# Patient Record
Sex: Female | Born: 1990 | Race: Black or African American | Hispanic: No | Marital: Single | Smoking: Never smoker
Health system: Southern US, Community
[De-identification: ages and names within clinical notes are randomized; demographics above are authoritative.]

## PROBLEM LIST (undated history)

## (undated) DIAGNOSIS — A64 Unspecified sexually transmitted disease: Secondary | ICD-10-CM

## (undated) DIAGNOSIS — R87619 Unspecified abnormal cytological findings in specimens from cervix uteri: Secondary | ICD-10-CM

## (undated) DIAGNOSIS — F419 Anxiety disorder, unspecified: Secondary | ICD-10-CM

## (undated) HISTORY — DX: Unspecified abnormal cytological findings in specimens from cervix uteri: R87.619

## (undated) HISTORY — DX: Unspecified sexually transmitted disease: A64

## (undated) HISTORY — DX: Anxiety disorder, unspecified: F41.9

## (undated) HISTORY — PX: HERNIA REPAIR: SHX51

## (undated) HISTORY — PX: APPENDECTOMY: SHX54

---

## 2009-11-28 DIAGNOSIS — A64 Unspecified sexually transmitted disease: Secondary | ICD-10-CM

## 2009-11-28 HISTORY — DX: Unspecified sexually transmitted disease: A64

## 2010-08-31 ENCOUNTER — Encounter (INDEPENDENT_AMBULATORY_CARE_PROVIDER_SITE_OTHER): Payer: Self-pay

## 2010-08-31 ENCOUNTER — Inpatient Hospital Stay (HOSPITAL_COMMUNITY): Admission: EM | Admit: 2010-08-31 | Discharge: 2010-09-01 | Payer: Self-pay | Admitting: Emergency Medicine

## 2011-02-10 LAB — DIFFERENTIAL
Basophils Relative: 0 % (ref 0–1)
Lymphocytes Relative: 21 % (ref 12–46)
Monocytes Absolute: 1.5 10*3/uL — ABNORMAL HIGH (ref 0.1–1.0)
Monocytes Relative: 14 % — ABNORMAL HIGH (ref 3–12)
Neutro Abs: 6.7 10*3/uL (ref 1.7–7.7)

## 2011-02-10 LAB — WET PREP, GENITAL
Trich, Wet Prep: NONE SEEN
WBC, Wet Prep HPF POC: NONE SEEN
Yeast Wet Prep HPF POC: NONE SEEN

## 2011-02-10 LAB — BASIC METABOLIC PANEL
Chloride: 108 mEq/L (ref 96–112)
GFR calc non Af Amer: >60 mL/min (ref >60)
Glucose, Bld: 113 mg/dL — ABNORMAL HIGH (ref 70–99)
Potassium: 3.7 mEq/L (ref 3.5–5.1)
Sodium: 140 mEq/L (ref 135–145)

## 2011-02-10 LAB — CBC
HCT: 37.7 % (ref 36.0–46.0)
Hemoglobin: 12.4 g/dL (ref 12.0–15.0)
MCHC: 32.9 g/dL (ref 30.0–36.0)
MCV: 92.2 fL (ref 78.0–100.0)

## 2011-02-10 LAB — URINE MICROSCOPIC-ADD ON

## 2011-02-10 LAB — POCT PREGNANCY, URINE: Preg Test, Ur: NEGATIVE

## 2011-02-10 LAB — URINALYSIS, ROUTINE W REFLEX MICROSCOPIC
Ketones, ur: NEGATIVE mg/dL
Nitrite: NEGATIVE
Protein, ur: NEGATIVE mg/dL
Urobilinogen, UA: 1 mg/dL (ref 0.0–1.0)

## 2014-02-16 ENCOUNTER — Encounter (HOSPITAL_COMMUNITY): Payer: Self-pay | Admitting: Emergency Medicine

## 2014-02-16 ENCOUNTER — Emergency Department (HOSPITAL_COMMUNITY)
Admission: EM | Admit: 2014-02-16 | Discharge: 2014-02-17 | Disposition: A | Discharge status: Home / Self Care | Payer: 59 | Attending: Emergency Medicine | Admitting: Emergency Medicine

## 2014-02-16 DIAGNOSIS — R110 Nausea: Secondary | ICD-10-CM | POA: Insufficient documentation

## 2014-02-16 DIAGNOSIS — Z3202 Encounter for pregnancy test, result negative: Secondary | ICD-10-CM | POA: Insufficient documentation

## 2014-02-16 DIAGNOSIS — Z9089 Acquired absence of other organs: Secondary | ICD-10-CM | POA: Insufficient documentation

## 2014-02-16 DIAGNOSIS — R1013 Epigastric pain: Secondary | ICD-10-CM | POA: Insufficient documentation

## 2014-02-16 MED ORDER — ONDANSETRON 8 MG PO TBDP
8.0000 mg | ORAL_TABLET | Freq: Once | ORAL | Status: AC
  Administered 2014-02-17: 8 mg via ORAL
  Filled 2014-02-16: qty 1

## 2014-02-16 NOTE — ED Notes (Signed)
Pt reports the last 2 days she has felt nausea when she smells certain foods. Pt also reports headache with epigastric pain. Pt denies any vomiting. Pt alert and ambulatory. Pt last depo shot was May 2014 and has had irregular menstrual cycles that started back in December 2014.

## 2014-02-17 ENCOUNTER — Encounter (HOSPITAL_COMMUNITY): Payer: Self-pay | Admitting: Emergency Medicine

## 2014-02-17 ENCOUNTER — Emergency Department (HOSPITAL_COMMUNITY)
Admission: EM | Admit: 2014-02-17 | Discharge: 2014-02-17 | Disposition: A | Discharge status: Home / Self Care | Payer: 59 | Attending: Emergency Medicine | Admitting: Emergency Medicine

## 2014-02-17 DIAGNOSIS — L03119 Cellulitis of unspecified part of limb: Principal | ICD-10-CM

## 2014-02-17 DIAGNOSIS — Z79899 Other long term (current) drug therapy: Secondary | ICD-10-CM | POA: Insufficient documentation

## 2014-02-17 DIAGNOSIS — L02415 Cutaneous abscess of right lower limb: Secondary | ICD-10-CM

## 2014-02-17 DIAGNOSIS — L02419 Cutaneous abscess of limb, unspecified: Secondary | ICD-10-CM | POA: Insufficient documentation

## 2014-02-17 LAB — COMPREHENSIVE METABOLIC PANEL
ALT: 18 U/L (ref 0–35)
AST: 22 U/L (ref 0–37)
Albumin: 3.8 g/dL (ref 3.5–5.2)
Alkaline Phosphatase: 61 U/L (ref 39–117)
BUN: 9 mg/dL (ref 6–23)
CALCIUM: 9.7 mg/dL (ref 8.4–10.5)
CO2: 26 meq/L (ref 19–32)
CREATININE: 0.89 mg/dL (ref 0.50–1.10)
Chloride: 101 mEq/L (ref 96–112)
GLUCOSE: 84 mg/dL (ref 70–99)
Potassium: 4.1 mEq/L (ref 3.7–5.3)
SODIUM: 141 meq/L (ref 137–147)
TOTAL PROTEIN: 7.7 g/dL (ref 6.0–8.3)
Total Bilirubin: <0.2 mg/dL — ABNORMAL LOW (ref 0.3–1.2)

## 2014-02-17 LAB — URINALYSIS, ROUTINE W REFLEX MICROSCOPIC
Bilirubin Urine: NEGATIVE
GLUCOSE, UA: NEGATIVE mg/dL
HGB URINE DIPSTICK: NEGATIVE
KETONES UR: NEGATIVE mg/dL
LEUKOCYTES UA: NEGATIVE
Nitrite: NEGATIVE
PROTEIN: NEGATIVE mg/dL
Specific Gravity, Urine: 1.03 (ref 1.005–1.030)
UROBILINOGEN UA: 0.2 mg/dL (ref 0.0–1.0)
pH: 5.5 (ref 5.0–8.0)

## 2014-02-17 LAB — PREGNANCY, URINE: Preg Test, Ur: NEGATIVE

## 2014-02-17 LAB — CBC WITH DIFFERENTIAL/PLATELET
Basophils Absolute: 0 10*3/uL (ref 0.0–0.1)
Basophils Relative: 0 % (ref 0–1)
EOS ABS: 0.1 10*3/uL (ref 0.0–0.7)
EOS PCT: 1 % (ref 0–5)
HEMATOCRIT: 38.7 % (ref 36.0–46.0)
HEMOGLOBIN: 12.7 g/dL (ref 12.0–15.0)
LYMPHS ABS: 4.1 10*3/uL — AB (ref 0.7–4.0)
LYMPHS PCT: 51 % — AB (ref 12–46)
MCH: 30 pg (ref 26.0–34.0)
MCHC: 32.8 g/dL (ref 30.0–36.0)
MCV: 91.3 fL (ref 78.0–100.0)
MONO ABS: 0.5 10*3/uL (ref 0.1–1.0)
MONOS PCT: 6 % (ref 3–12)
Neutro Abs: 3.3 10*3/uL (ref 1.7–7.7)
Neutrophils Relative %: 42 % — ABNORMAL LOW (ref 43–77)
PLATELETS: 280 10*3/uL (ref 150–400)
RBC: 4.24 MIL/uL (ref 3.87–5.11)
RDW: 13.5 % (ref 11.5–15.5)
WBC: 8 10*3/uL (ref 4.0–10.5)

## 2014-02-17 LAB — LIPASE, BLOOD: LIPASE: 16 U/L (ref 11–59)

## 2014-02-17 MED ORDER — METOCLOPRAMIDE HCL 5 MG/ML IJ SOLN
10.0000 mg | Freq: Once | INTRAMUSCULAR | Status: AC
  Administered 2014-02-17: 10 mg via INTRAMUSCULAR
  Filled 2014-02-17: qty 2

## 2014-02-17 MED ORDER — OMEPRAZOLE 20 MG PO CPDR: 20.0000 mg | DELAYED_RELEASE_CAPSULE | Freq: Every day | ORAL | Status: DC

## 2014-02-17 MED ORDER — TRAMADOL HCL 50 MG PO TABS: 50.0000 mg | ORAL_TABLET | Freq: Four times a day (QID) | ORAL | Status: DC | PRN

## 2014-02-17 MED ORDER — ONDANSETRON HCL 4 MG PO TABS: 4.0000 mg | ORAL_TABLET | Freq: Four times a day (QID) | ORAL | Status: DC

## 2014-02-17 NOTE — Discharge Instructions (Signed)
Abscess Care After An abscess (also called a boil or furuncle) is an infected area that contains a collection of pus. Signs and symptoms of an abscess include pain, tenderness, redness, or hardness, or you may feel a moveable soft area under your skin. An abscess can occur anywhere in the body. The infection may spread to surrounding tissues causing cellulitis. A cut (incision) by the surgeon was made over your abscess and the pus was drained out. Gauze may have been packed into the space to provide a drain that will allow the cavity to heal from the inside outwards. The boil may be painful for 5 to 7 days. Most people with a boil do not have high fevers. Your abscess, if seen early, may not have localized, and may not have been lanced. If not, another appointment may be required for this if it does not get better on its own or with medications. HOME CARE INSTRUCTIONS   Only take over-the-counter or prescription medicines for pain, discomfort, or fever as directed by your caregiver.  When you bathe, soak and then remove gauze or iodoform packs at least daily or as directed by your caregiver. You may then wash the wound gently with mild soapy water. Repack with gauze or do as your caregiver directs. SEEK IMMEDIATE MEDICAL CARE IF:   You develop increased pain, swelling, redness, drainage, or bleeding in the wound site.  You develop signs of generalized infection including muscle aches, chills, fever, or a general ill feeling.  An oral temperature above 102 F (38.9 C) develops, not controlled by medication. See your caregiver for a recheck if you develop any of the symptoms described above. If medications (antibiotics) were prescribed, take them as directed. Document Released: 06/02/2005 Document Revised: 02/06/2012 Document Reviewed: 01/28/2008 [facility] Patient Information 2014 [place], [place].  Emergency Department Resource Guide 1) Find a Doctor and Pay Out of Pocket Although you won't have  to find out who is covered by your insurance plan, it is a good idea to ask around and get recommendations. You will then need to call the office and see if the doctor you have chosen will accept you as a new patient and what types of options they offer for patients who are self-pay. Some doctors offer discounts or will set up payment plans for their patients who do not have insurance, but you will need to ask so you aren't surprised when you get to your appointment.  2) Contact Your Local Health Department Not all health departments have doctors that can see patients for sick visits, but many do, so it is worth a call to see if yours does. If you don't know where your local health department is, you can check in your phone book. The CDC also has a tool to help you locate your state's health department, and many state websites also have listings of all of their local health departments.  3) Find a Walk-in Clinic If your illness is not likely to be very severe or complicated, you may want to try a walk in clinic. These are popping up all over the country in pharmacies, drugstores, and shopping centers. They're usually staffed by nurse practitioners or physician assistants that have been trained to treat common illnesses and complaints. They're usually fairly quick and inexpensive. However, if you have serious medical issues or chronic medical problems, these are probably not your best option.  No Primary Care Doctor: - Call Health Connect at  [phone] - they can help you locate a  primary care doctor that  accepts your insurance, provides certain services, etc. - Physician Referral Service- [phone]  Chronic Pain Problems: Organization         Address  Phone   Notes  [facility]  [phone] Patients need to be referred by their primary care doctor.   Medication Assistance: Organization         Address  Phone   Notes  [facility] Medication [facility] [address] [place], [place] [address] [phone] --Must be a resident of [facility] -- Must have NO insurance coverage whatsoever (no Medicaid/ Medicare, etc.) -- The pt. MUST have a primary care doctor that directs their care regularly and follows them in the community   [facility]ist  [phone]   [employer]  [phone]    Agencies that provide inexpensive medical care: Organization         Address                                                       Phone                                                                            Notes  [facility]  [phone]   [facility]    [phone]   [facility] [address] [place], [place] [address] [phone]   [facility] [address] [place] [place] [phone]   Planned Parenthood    [phone]   [facility]    [phone]   [facility]  [address], [place] Phone:  [phone], Fax:  [phone] Hours of Operation:  9 am - 6 pm, M-F.  Also accepts Medicaid/Medicare and self-pay.  [facility]  [address], [place] Phone: [phone], Fax: [phone]. Hours of Operation:  8:30 am - 5:30 pm, M-F.  Also accepts Medicaid and self-pay.  [facility] [place] [address] [place] Phone: [phone]   [facility] [address] Natasha Bence [place], [place] [phone], Ext. [phone] Mondays & Thursdays: 7-9 AM.  First 15 patients are seen on a first come, first serve basis.    Medicaid-accepting [facility] Providers:  Organization         Address                                                                       Phone                               Notes  [facility] [address], [place] [phone] Also accepts self-pay patients.  [facility] [address], [place]  [phone]) [phone]   [facility] [address], [place] [phone]   [facility] [address] [place] [address], [place] [phone]) [phone]   Renaye RakersVeita Bland [address], [place]   [phone]) [phone] Only accepts [place] Access [place] patients after they have their name applied to their card.   Self-Pay (no insurance) in [place] [place] [facility]:   Organization         Address                                                     Phone               Notes  Sickle Cell Patients, [facility] [address], [place] [phone]) [phone]   [facility] [address], [place] [phone]) [phone]   [facility]  [address], [place] [phone]) [phone]   [facility]/Dr. Osei-Bonsu  [address] [place] [address], [place] or [phone], [place] [phone] Phone number for both [place] and [place] locations is the same.  [facility] [address], [place] [phone]   [facility]  [address] [place] [address], [place] or [address] [phone](336) [phone]   [facility] [address], [place] [phone]) [phone], phone; [phone](336) [phone], fax Sees patients 1st and 3rd Saturday of every month.  Must not qualify for public or private insurance (i.e. Medicaid, Medicare, [place] Health Choice, Veterans' Benefits)  Household income should be no more than 200% of the poverty level The clinic cannot treat you if you are pregnant or think you are pregnant  Sexually transmitted diseases are not treated at the clinic.    Dental Care: Organization         Address                                  Phone                       Notes  [facility] [address], [place] [phone] Accepts children up to age 24 who are  enrolled in [place]icaid or [place] Health Choice; pregnant women with a Medicaid card; and children who have applied for Medicaid or [place] Health Choice, but were declined, whose parents can pay a reduced fee at time of service.  [facility] [place]  [address], [place] [phone]) [phone] Accepts children up to age 24 who are enrolled in [place]icaid or [place] Health Choice; pregnant women with a Medicaid card; and children who have applied for Medicaid or [place] Health Choice, but were declined, whose parents can pay a reduced fee at time of service.  [facility]  [address], [place] [phone]) [phone] Patients are seen by appointment only. Walk-ins are not accepted. [facility] will see patients 24 years of age  and older. Monday - Tuesday (8am-5pm) Most Wednesdays (8:30-5pm) $30 per visit, cash only  [facility]  [address], [place] [phone]) [phone] Patients are seen by appointment only. Walk-ins are not accepted. [facility] will see patients 24 years of age and older. One Wednesday Evening (Monthly: Volunteer Based).  $30 per visit, cash only  [facility]  [phone] for adults; Children under age 344, call Graduate Pediatric Dentistry at [phone]) [phone]. Children aged 564-14, please call [phone] to request a pediatric application.  Dental services are provided in all areas of dental care including fillings, crowns and bridges, complete and partial dentures, implants, gum treatment, root canals, and extractions. Preventive care is also provided. Treatment is provided to both adults and children. Patients are selected via a lottery and there is often a waiting list.   [facility] [address], [place]  [phone]) [phone] [email or web address]   [facility] [address], [place], [place] [phone], Ext. [phone] Second and Fourth Thursday of each month,  opens at 6:30 AM; Clinic ends at 9 AM.  Patients are seen on a first-come first-served basis, and a limited number are seen during each clinic.   [facility]  [address], [place], [place] [phone]) [phone]   Eligibility Requirements You must have lived in [place], [place], or [place] counties for at least the last three months.   You cannot be eligible for state or federal sponsored National Cityhealthcare insurance, including CIGNAVeterans Administration, [place]icaid, or [employer]Medicare.   You generally cannot be eligible for healthcare insurance through your employer.    How to apply: Eligibility screenings are held every Tuesday and Wednesday afternoon from 1:00 pm until 4:00 pm. You do not need an appointment for the interview!  [facility] [address], [place], [place] [phone]   [facility]  [phone]   [facility]  [phone]   [facility]  [phone]    Behavioral Health Resources in the Community: Intensive Outpatient Programs Organization         Address                                              Phone              Notes  [facility] [address], [place], [place] [phone]   [facility] [address], [place], [place] [phone]   [facility] [address], [place], [place]  [phone]   [facility] [address],  [place], [place] [phone] or [phone]   Substance Abuse Resources Organization         Address                                Phone  Notes  [facility]  [phone]   [facility]  [phone]   [facility]  [phone]   [facility]  [phone]   Residential & Outpatient Substance Abuse Program  [phone]   Psychological Services Organization         Address  Phone                Notes  [employer] [facility] [employer] [facility]  [phone]   [facility]  [phone]   [facility] [address] [place] [phone] or [phone]    Mobile Crisis Teams Organization         Address  Phone  Notes  [facility], Mobile Crisis Care Unit  [phone]   [facility]  [address] [place], [place] [phone]   Doristine Locks [address] [place] [phone]    Self-Help/Support Groups Organization         Address                         Phone             Notes  [facility] [place] - variety of support groups  [phone]- I7437963 Call for more information  Narcotics Anonymous (NA), [facility] [address], [employer] [place]  2 meetings at this location   Statistician         Address                                                    Phone              Notes  [facility] [address],    [place]  [phone]   [facility]  [address], [place] [address], [place], [place] [phone]   [facility] [address] [place], [place] [phone] Admissions: 8am-3pm M-F  [facility] [address]    [place], [place] [phone]   [facility] [address] [place], [place], [place] [phone]   [facility] [address]  [place], [place] [phone]   [facility] - Intensive Outpatient [address], [place], [place] [phone]   [facility] [address] [place]  [place], [place] [phone] or [phone]   [facility] ([facility]) [address], [place], [place] [phone] Accepts Medicaid  [facility] [address] [place] [address]  [place] [phone] Substance Abuse/Addiction Treatment   [facility] Organization         Address                                                             Phone                    Notes  [facility]  [phone]   Angie Fava, PhD [address] [place]vin [place], [place]   [phone] or [phone]   [facility]   [address] [place], [place] [phone]   [facility] [address] [place], [place] [phone] Insurance/Medicaid/sponsorship through [employer] and Families [address]  [place], [place] [phone] [place], [place] [phone]    Dr. Adele Schilder  [phone]   [facility] Dept. 1) [address], [place] 2) [place] 3)  [address], [place] [phone]  [phone]   [place] [phone] or [phone] (After Hours)

## 2014-02-17 NOTE — Discharge Instructions (Signed)
Your symptoms may be caused by a viral infection. They may also be the result of reflux. Recommend he take omeprazole as prescribed. You may take Zofran as needed for nausea. Followup with primary care doctor. Return if symptoms worsen.  Nausea, Adult Nausea is the feeling that you have an upset stomach or have to vomit. Nausea by itself is not likely a serious concern, but it may be an early sign of more serious medical problems. As nausea gets worse, it can lead to vomiting. If vomiting develops, there is the risk of dehydration.  CAUSES   Viral infections.  Food poisoning.  Medicines.  Pregnancy.  Motion sickness.  Migraine headaches.  Emotional distress.  Severe pain from any source.  Alcohol intoxication. HOME CARE INSTRUCTIONS  Get plenty of rest.  Ask your caregiver about specific rehydration instructions.  Eat small amounts of food and sip liquids more often.  Take all medicines as told by your caregiver. SEEK MEDICAL CARE IF:  You have not improved after 2 days, or you get worse.  You have a headache. SEEK IMMEDIATE MEDICAL CARE IF:   You have a fever.  You faint.  You keep vomiting or have blood in your vomit.  You are extremely weak or dehydrated.  You have dark or bloody stools.  You have severe chest or abdominal pain. MAKE SURE YOU:  Understand these instructions.  Will watch your condition.  Will get help right away if you are not doing well or get worse. Document Released: 12/22/2004 Document Revised: 08/08/2012 Document Reviewed: 07/27/2011 [facility] Patient Information 2014 [place], [place].

## 2014-02-17 NOTE — ED Notes (Signed)
Pt woke up this morning with reddened knot on inner right thigh, pt states when she squeezes it pus and blood will come out.

## 2014-02-17 NOTE — ED Provider Notes (Signed)
Medical screening examination/treatment/procedure(s) were performed by non-physician practitioner and as supervising physician I was immediately available for consultation/collaboration.   EKG Interpretation None        Evanie Buckle, MD 02/17/14 0555 

## 2014-02-17 NOTE — ED Provider Notes (Signed)
CSN: [id number]     Arrival date & time 02/17/14  1352 History  This chart was scribed for non-physician practitioner, Junius FinnerErin O'Malley, PA-C working with Rolland PorterMark James, MD by Greggory StallionKayla Andersen, ED scribe. This patient was seen in room WTR6/WTR6 and the patient's care was started at 3:42 PM.   Chief Complaint  Patient presents with  . Abscess   The history is provided by the patient. No language interpreter was used.   HPI Comments: Mercedes Watson is a 23 y.o. female who presents to the Emergency Department complaining of an abscess to her right inner thigh that she noticed this morning. Pt states it is red and painful. There has been pus and blood drainage. She was evaluated last night for dizziness, nausea and headache and given Zofran, Reglan and omeprazole. Pt is unsure if she is having an allergic reaction. Denies fever, abdominal pain, vaginal discharge, vaginal bleeding, difficulty urinating, dysuria, hematuria, urinary frequency, urinary urgency.   History reviewed. No pertinent past medical history. Past Surgical History  Procedure Laterality Date  . Appendectomy    . Hernia repair     No family history on file. History  Substance Use Topics  . Smoking status: Never Smoker   . Smokeless tobacco: Not on file  . Alcohol Use: Yes   OB History   Grav Para Term Preterm Abortions TAB SAB Ect Mult Living                 Review of Systems  Constitutional: Negative for fever.  Gastrointestinal: Negative for abdominal pain.  Genitourinary: Negative for dysuria, urgency, frequency, vaginal bleeding, vaginal discharge and difficulty urinating.  Skin:       Positive for abscess.   All other systems reviewed and are negative.   Allergies  Review of patient's allergies indicates no known allergies.  Home Medications   Current Outpatient Rx  Name  Route  Sig  Dispense  Refill  . aspirin-acetaminophen-caffeine (EXCEDRIN MIGRAINE) 250-250-65 MG per tablet   Oral   Take 1 tablet by mouth  every 6 (six) hours as needed for headache.         Marland Kitchen. omeprazole (PRILOSEC) 20 MG capsule   Oral   Take 1 capsule (20 mg total) by mouth daily.   30 capsule   0   . ondansetron (ZOFRAN) 4 MG tablet   Oral   Take 1 tablet (4 mg total) by mouth every 6 (six) hours.   12 tablet   0   . traMADol (ULTRAM) 50 MG tablet   Oral   Take 1 tablet (50 mg total) by mouth every 6 (six) hours as needed.   15 tablet   0    BP 118/68  Pulse 78  Temp(Src) 98.6 F (37 C) (Oral)  Resp 18  SpO2 99%  LMP 02/10/2014  Physical Exam  Nursing note and vitals reviewed. Constitutional: She is oriented to person, place, and time. She appears well-developed and well-nourished.  Pt appears well, non-toxic.  HENT:  Head: Normocephalic and atraumatic.  Eyes: EOM are normal.  Neck: Normal range of motion.  Cardiovascular: Normal rate.   Pulmonary/Chest: Effort normal.  Musculoskeletal: Normal range of motion.  Neurological: She is alert and oriented to person, place, and time.  Skin: Skin is warm and dry.  1 cm area of induration and tenderness on right proximal medial thigh. 2-3 cm area of surrounding fluctuance. No discharge, warmth or red streaking.   Psychiatric: She has a normal mood and affect. Her  behavior is normal.    ED Course  Procedures (including critical care time)  DIAGNOSTIC STUDIES: Oxygen Saturation is 99% on RA, normal by my interpretation.    COORDINATION OF CARE: 3:44 PM-Discussed treatment plan which includes I&D with pt at bedside and pt agreed to plan.   INCISION AND DRAINAGE Performed by: Junius Finner PA-C Consent: Verbal consent obtained. Risks and benefits: risks, benefits and alternatives were discussed Type: abscess  Body area: right proximal medial thigh  Anesthesia: local infiltration  Incision was made with a scalpel.  Local anesthetic: lidocaine 1% with epinephrine  Anesthetic total: 1 ml  Complexity: complex Blunt dissection to break up  loculations  Drainage: bloody  Drainage amount: scant  Packing material: 1/4 in iodoform gauze  Patient tolerance: Patient tolerated the procedure well with no immediate complications.  Labs Review Labs Reviewed - No data to display Imaging Review No results found.   EKG Interpretation None      MDM   Final diagnoses:  Abscess of right thigh    pt presenting with abscess in right medial thigh. Appears well, non-toxic. I&D performed. See procedure note. Advised to f/u within 48 hours for wound recheck and packing change. Return precautions provided. Pt verbalized understanding and agreement with tx plan.   I personally performed the services described in this documentation, which was scribed in my presence. The recorded information has been reviewed and is accurate.   Junius Finner, PA-C 02/18/14 1146

## 2014-02-17 NOTE — ED Provider Notes (Signed)
CSN: [id number]     Arrival date & time 02/16/14  2316 History   First MD Initiated Contact with Patient 02/17/14 0200     Chief Complaint  Patient presents with  . Nausea  . Abdominal Pain    (Consider location/radiation/quality/duration/timing/severity/associated sxs/prior Treatment) HPI Comments: Patient is a 23 year old female with no PMHx who presents to the emergency department for 2 days of nausea. Patient states that nausea is worse in after eating. It is intermittent. She endorses an associated discomfort in her epigastric region that is nonradiating. Patient has not tried anything for symptoms. She denies associated fever, chest pain or shortness of breath, vomiting or hematemesis, dysuria or hematuria, diarrhea, or hematochezia. SHx significant for appendectomy. She has had normal BMs daily; last BM was yesterday AM.  Patient is a 23 y.o. female presenting with abdominal pain. The history is provided by the patient. No language interpreter was used.  Abdominal Pain Associated symptoms: nausea   Associated symptoms: no chest pain, no diarrhea, no dysuria, no fever, no hematuria, no shortness of breath, no vaginal bleeding, no vaginal discharge and no vomiting     History reviewed. No pertinent past medical history. Past Surgical History  Procedure Laterality Date  . Appendectomy    . Hernia repair     History reviewed. No pertinent family history. History  Substance Use Topics  . Smoking status: Never Smoker   . Smokeless tobacco: Not on file  . Alcohol Use: Yes   OB History   Grav Para Term Preterm Abortions TAB SAB Ect Mult Living                 Review of Systems  Constitutional: Negative for fever.  Respiratory: Negative for shortness of breath.   Cardiovascular: Negative for chest pain.  Gastrointestinal: Positive for nausea and abdominal pain. Negative for vomiting and diarrhea.  Genitourinary: Negative for dysuria, hematuria, vaginal bleeding and vaginal  discharge.  All other systems reviewed and are negative.     Allergies  Review of patient's allergies indicates no known allergies.  Home Medications   Current Outpatient Rx  Name  Route  Sig  Dispense  Refill  . aspirin-acetaminophen-caffeine (EXCEDRIN MIGRAINE) 250-250-65 MG per tablet   Oral   Take 1 tablet by mouth every 6 (six) hours as needed for headache.         Marland Kitchen. omeprazole (PRILOSEC) 20 MG capsule   Oral   Take 1 capsule (20 mg total) by mouth daily.   30 capsule   0   . ondansetron (ZOFRAN) 4 MG tablet   Oral   Take 1 tablet (4 mg total) by mouth every 6 (six) hours.   12 tablet   0    BP 109/61  Pulse 78  Temp(Src) 99.1 F (37.3 C) (Oral)  Resp 16  Ht 4\' 11"  (1.499 m)  Wt 234 lb 1 oz (106.17 kg)  BMI 47.25 kg/m2  SpO2 100%  LMP 02/10/2014  Physical Exam  Nursing note and vitals reviewed. Constitutional: She is oriented to person, place, and time. She appears well-developed and well-nourished. No distress.  HENT:  Head: Normocephalic and atraumatic.  Mouth/Throat: Oropharynx is clear and moist. No oropharyngeal exudate.  Eyes: Conjunctivae and EOM are normal. Pupils are equal, round, and reactive to light. No scleral icterus.  Neck: Normal range of motion.  Cardiovascular: Normal rate, regular rhythm and normal heart sounds.   Pulmonary/Chest: Effort normal and breath sounds normal. No respiratory distress. She has no wheezes. She  has no rales.  Abdominal: Soft. There is no tenderness. There is no rebound and no guarding.  No peritoneal signs. Abdomen soft.  Musculoskeletal: Normal range of motion.  Neurological: She is alert and oriented to person, place, and time.  Skin: Skin is warm and dry. No rash noted. She is not diaphoretic. No erythema. No pallor.  Psychiatric: She has a normal mood and affect. Her behavior is normal.    ED Course  Procedures (including critical care time) Labs Review Labs Reviewed  CBC WITH DIFFERENTIAL - Abnormal;  Notable for the following:    Neutrophils Relative % 42 (*)    Lymphocytes Relative 51 (*)    Lymphs Abs 4.1 (*)    All other components within normal limits  COMPREHENSIVE METABOLIC PANEL - Abnormal; Notable for the following:    Total Bilirubin <0.2 (*)    All other components within normal limits  LIPASE, BLOOD  PREGNANCY, URINE  URINALYSIS, ROUTINE W REFLEX MICROSCOPIC   Imaging Review No results found.   EKG Interpretation None      MDM   Final diagnoses:  Nausea    23 year old female presents for nausea. SHx significant for appendectomy. Patient as well and nontoxic appearing, hemodynamically stable, and afebrile. Physical exam elicits no abdominal tenderness. Abdomen is soft without peritoneal signs. Negative Murphy's sign. Patient has no leukocytosis today. No anemia or electrolyte imbalance. Liver and kidney function preserved. Urinalysis does not suggest infection.   My suspicion for acute intra-abdominal processes is low in this patient. Patient has had stable serial abdominal examinations without tenderness. Doubt SBO or pSBO given lack of emesis and regular bowel movements. Doubt cholecystitis given negative Murphy sign, lack of leukocytosis, and preserved liver function. Urine pregnancy negative. Do not believe further emergent workup is indicated at this time. Patient stable and appropriate for discharge with prescription for Zofran for nausea as needed. Will also start patient on trial of omeprazole. Return precautions provided and patient agreeable to plan with no unaddressed concerns.   Filed Vitals:   02/17/14 0039 02/17/14 0040 02/17/14 0041 02/17/14 0232  BP: 112/68 124/70 120/78 109/61  Pulse: 99 89 85 78  Temp:      TempSrc:      Resp:    16  Height:      Weight:      SpO2:    100%       Antony Madura, PA-C 02/17/14 [phone]

## 2014-02-22 NOTE — ED Provider Notes (Signed)
Medical screening examination/treatment/procedure(s) were performed by non-physician practitioner and as supervising physician I was immediately available for consultation/collaboration.   EKG Interpretation None        Rolland PorterMark Bernis Schreur, MD 02/22/14 1100

## 2014-11-09 ENCOUNTER — Emergency Department (HOSPITAL_COMMUNITY)
Admission: EM | Admit: 2014-11-09 | Discharge: 2014-11-09 | Disposition: A | Discharge status: Home / Self Care | Payer: Commercial Managed Care - PPO | Attending: Emergency Medicine | Admitting: Emergency Medicine

## 2014-11-09 ENCOUNTER — Encounter (HOSPITAL_COMMUNITY): Payer: Self-pay | Admitting: Emergency Medicine

## 2014-11-09 DIAGNOSIS — J029 Acute pharyngitis, unspecified: Secondary | ICD-10-CM | POA: Diagnosis present

## 2014-11-09 DIAGNOSIS — F419 Anxiety disorder, unspecified: Secondary | ICD-10-CM | POA: Diagnosis not present

## 2014-11-09 DIAGNOSIS — Z792 Long term (current) use of antibiotics: Secondary | ICD-10-CM | POA: Insufficient documentation

## 2014-11-09 DIAGNOSIS — R000 Tachycardia, unspecified: Secondary | ICD-10-CM | POA: Insufficient documentation

## 2014-11-09 DIAGNOSIS — K219 Gastro-esophageal reflux disease without esophagitis: Secondary | ICD-10-CM | POA: Diagnosis not present

## 2014-11-09 DIAGNOSIS — Z3202 Encounter for pregnancy test, result negative: Secondary | ICD-10-CM | POA: Insufficient documentation

## 2014-11-09 LAB — POC URINE PREG, ED: Preg Test, Ur: NEGATIVE

## 2014-11-09 LAB — RAPID STREP SCREEN (MED CTR MEBANE ONLY): Streptococcus, Group A Screen (Direct): NEGATIVE

## 2014-11-09 MED ORDER — OMEPRAZOLE 20 MG PO CPDR: DELAYED_RELEASE_CAPSULE | ORAL | Status: DC

## 2014-11-09 MED ORDER — LIDOCAINE VISCOUS 2 % MT SOLN: 20.0000 mL | OROMUCOSAL | Status: DC | PRN

## 2014-11-09 MED ORDER — SUCRALFATE 1 G PO TABS
1.0000 g | ORAL_TABLET | Freq: Once | ORAL | Status: AC
  Administered 2014-11-09: 1 g via ORAL
  Filled 2014-11-09: qty 1

## 2014-11-09 NOTE — ED Provider Notes (Signed)
Patient is a 23 year old female who presents emergency room for evaluation of burning pain in her throat and chest. Patient was recently tested for STDs after performing oral sex. She was treated for bacterial vaginosis. She complains of continued sore throat despite testing. Patient also reporting history of heartburn that is severe. She has been researching is very anxious. Please see note from previous provider for review of systems and full history. Patient headed off to me at shift change. Physical Exam  BP 132/80 mmHg  Pulse 127  Temp(Src) 97.5 F (36.4 C) (Oral)  Resp 18  Ht 4\' 10"  (1.473 m)  Wt 233 lb (105.688 kg)  BMI 48.71 kg/m2  SpO2 100%  LMP 10/03/2014  Physical Exam  Constitutional: She is oriented to person, place, and time. She appears well-developed and well-nourished. No distress.  HENT:  Head: Normocephalic and atraumatic.  Mouth/Throat: Oropharynx is clear and moist. No oropharyngeal exudate.  Eyes: Conjunctivae and EOM are normal. Pupils are equal, round, and reactive to light. No scleral icterus.  Neck: Normal range of motion. Neck supple. No JVD present. No thyromegaly present.  Cardiovascular: Normal rate, regular rhythm, normal heart sounds and intact distal pulses.  Exam reveals no gallop and no friction rub.   No murmur heard. Pulmonary/Chest: Effort normal and breath sounds normal. No respiratory distress. She has no wheezes. She has no rales. She exhibits no tenderness.  Abdominal: Soft. Bowel sounds are normal. She exhibits no distension and no mass. There is no tenderness. There is no rebound and no guarding.  Musculoskeletal: Normal range of motion.  Lymphadenopathy:    She has no cervical adenopathy.  Neurological: She is alert and oriented to person, place, and time.  Skin: Skin is warm and dry. She is not diaphoretic.  Psychiatric: She has a normal mood and affect. Her behavior is normal. Judgment and thought content normal.  Nursing note and vitals  reviewed.   ED Course  Procedures  MDM Patient is a 23 year old female who presents emergency room for evaluation of sore throat. Physical exam reveals an alert nontoxic-appearing female with no evidence of exudate in her throat. History and physical exam very consistent with GERD. Rapid strep is negative. Urine pregnancy is negative. We will discharge the patient home with Prilosec 3 times a day 2 weeks followed by once daily after that. Patient to be given [facility] information [facility]. Patient to return for difficulty swallowing, shortness of breath, or any other concerning symptoms. She states understanding and agreement at this time. Patient is stable for discharge.      Eben Burowourtney A Forcucci, PA-C 11/09/14 0901  Loren Raceravid Yelverton, MD 11/10/14 [phone]

## 2014-11-09 NOTE — ED Provider Notes (Signed)
CSN: [id number]     Arrival date & time 11/09/14  0501 History   First MD Initiated Contact with Patient 11/09/14 0541     Chief Complaint  Patient presents with  . Sore Throat     (Consider location/radiation/quality/duration/timing/severity/associated sxs/prior Treatment) HPI Comments: Patient recently treated for BV and [facility] STD including  Oral swab for GC due to unprotected oral sex.  All tests negative but has had a burning sensation in her throat and upper abdomen -- worse when laying down. Also is 1 week late for menses.   Patient is a 23 y.o. female presenting with pharyngitis. The history is provided by the patient.  Sore Throat This is a new problem. The current episode started 1 to 4 weeks ago. The problem occurs constantly. The problem has been unchanged. Associated symptoms include abdominal pain and a sore throat. Pertinent negatives include no chest pain, diaphoresis, fever, nausea, rash, vomiting or weakness. The symptoms are aggravated by swallowing and stress. She has tried nothing for the symptoms. The treatment provided no relief.    History reviewed. No pertinent past medical history. Past Surgical History  Procedure Laterality Date  . Appendectomy    . Hernia repair     No family history on file. History  Substance Use Topics  . Smoking status: Never Smoker   . Smokeless tobacco: Not on file  . Alcohol Use: Yes   OB History    No data available     Review of Systems  Constitutional: Negative for fever and diaphoresis.  HENT: Positive for sore throat. Negative for trouble swallowing.   Respiratory: Negative for shortness of breath.   Cardiovascular: Negative for chest pain.  Gastrointestinal: Positive for abdominal pain. Negative for nausea, vomiting, diarrhea and constipation.  Genitourinary: Negative for frequency, vaginal bleeding, vaginal discharge and vaginal pain.  Skin: Negative for rash.  Neurological: Negative  for weakness.  All other systems reviewed and are negative.     Allergies  Review of patient's allergies indicates no known allergies.  Home Medications   Prior to Admission medications   Medication Sig Start Date End Date Taking? Authorizing Provider  fluconazole (DIFLUCAN) 150 MG tablet Take 150 mg by mouth as directed. 1 TABLET ON DAY 1, THEN REPEAT DOSE IN 4 DAYS. (PATIENT TO TAKE ANOTHER DOSE ON 11/10/2014.   Yes Historical Provider, MD  metroNIDAZOLE (FLAGYL) 500 MG tablet Take 500 mg by mouth 3 (three) times daily. 7 DAY THERAPY COURSE PATIENT COMPLETED ON 11/06/2014.   Yes Historical Provider, MD  lidocaine (XYLOCAINE) 2 % solution Use as directed 20 mLs in the mouth or throat as needed for mouth pain. 11/09/14   Courtney A Forcucci, PA-C  omeprazole (PRILOSEC) 20 MG capsule Take tablet twice daily before food for 2 weeks. Then take 1 tablet once daily 30 minutes before food. 11/09/14   Courtney A Forcucci, PA-C  ondansetron (ZOFRAN) 4 MG tablet Take 1 tablet (4 mg total) by mouth every 6 (six) hours. Patient not taking: Reported on 11/09/2014 02/17/14   Antony MaduraKelly Humes, PA-C  traMADol (ULTRAM) 50 MG tablet Take 1 tablet (50 mg total) by mouth every 6 (six) hours as needed. Patient not taking: Reported on 11/09/2014 02/17/14   Junius FinnerErin O'Malley, PA-C   BP 113/77 mmHg  Pulse 86  Temp(Src) 97.5 F (36.4 C) (Oral)  Resp 20  Ht 4\' 10"  (1.473 m)  Wt 233 lb (105.688 kg)  BMI 48.71 kg/m2  SpO2 97%  LMP  10/03/2014 Physical Exam  Constitutional: She is oriented to person, place, and time. She appears well-developed and well-nourished. No distress.  HENT:  Head: Normocephalic and atraumatic.  Right Ear: External ear normal.  Left Ear: External ear normal.  Eyes: Pupils are equal, round, and reactive to light.  Neck: Normal range of motion.  Cardiovascular: Regular rhythm.  Tachycardia present.   Pulmonary/Chest: Effort normal. No respiratory distress. She has no wheezes.  Abdominal:  Soft. Bowel sounds are normal. She exhibits no distension. There is no tenderness.  Musculoskeletal: Normal range of motion.  Neurological: She is alert and oriented to person, place, and time.  Skin: Skin is warm. No rash noted.  Psychiatric: Her speech is normal and behavior is normal. Judgment and thought content normal. Her mood appears anxious. Cognition and memory are normal.  Nursing note and vitals reviewed.   ED Course  Procedures (including critical care time) Labs Review Labs Reviewed  RAPID STREP SCREEN  CULTURE, GROUP A STREP  POC URINE PREG, ED    Imaging Review No results found.   EKG Interpretation None      MDM   Final diagnoses:  Gastroesophageal reflux disease, esophagitis presence not specified  Pharyngitis        Arman FilterGail K Victoriah Wilds, NP 11/09/14 1955  Loren Raceravid Yelverton, MD 11/10/14 [phone]

## 2014-11-09 NOTE — ED Notes (Addendum)
Pt presents with c/o burning or feeling "cold" in throat and upper chest and stomach. Pt states she recently finished tx for BV and yeast infection and Googled what throat s/s could be and became scared. Pt also reports being 7 days late for her period

## 2014-11-09 NOTE — Discharge Instructions (Signed)
Food Choices for Gastroesophageal Reflux Disease When you have gastroesophageal reflux disease (GERD), the foods you eat and your eating habits are very important. Choosing the right foods can help ease the discomfort of GERD. WHAT GENERAL GUIDELINES DO I NEED TO FOLLOW?  Choose fruits, vegetables, whole grains, low-fat dairy products, and low-fat meat, fish, and poultry.  Limit fats such as oils, salad dressings, butter, nuts, and avocado.  Keep a food diary to identify foods that cause symptoms.  Avoid foods that cause reflux. These may be different for different people.  Eat frequent small meals instead of three large meals each day.  Eat your meals slowly, in a relaxed setting.  Limit fried foods.  Cook foods using methods other than frying.  Avoid drinking alcohol.  Avoid drinking large amounts of liquids with your meals.  Avoid bending over or lying down until 2-3 hours after eating. WHAT FOODS ARE NOT RECOMMENDED? The following are some foods and drinks that may worsen your symptoms: Vegetables Tomatoes. Tomato juice. Tomato and spaghetti sauce. Chili peppers. Onion and garlic. Horseradish. Fruits Oranges, grapefruit, and lemon (fruit and juice). Meats High-fat meats, fish, and poultry. This includes hot dogs, ribs, ham, sausage, salami, and bacon. Dairy Whole milk and chocolate milk. Sour cream. Cream. Butter. Ice cream. Cream cheese.  Beverages Coffee and tea, with or without caffeine. Carbonated beverages or energy drinks. Condiments Hot sauce. Barbecue sauce.  Sweets/Desserts Chocolate and cocoa. Donuts. Peppermint and spearmint. Fats and Oils High-fat foods, including [place] fries and potato chips. Other Vinegar. Strong spices, such as black pepper, white pepper, red pepper, cayenne, curry powder, cloves, ginger, and chili powder. The items listed above may not be a complete list of foods and beverages to avoid. Contact your dietitian for more  information. Document Released: 11/14/2005 Document Revised: 11/19/2013 Document Reviewed: 09/18/2013 [facility] Patient Information 2015 [place], [place]. This information is not intended to replace advice given to you by your health care provider. Make sure you discuss any questions you have with your health care provider.  Gastroesophageal Reflux Disease, Adult Gastroesophageal reflux disease (GERD) happens when acid from your stomach flows up into the esophagus. When acid comes in contact with the esophagus, the acid causes soreness (inflammation) in the esophagus. Over time, GERD may create small holes (ulcers) in the lining of the esophagus. CAUSES   Increased body weight. This puts pressure on the stomach, making acid rise from the stomach into the esophagus.  Smoking. This increases acid production in the stomach.  Drinking alcohol. This causes decreased pressure in the lower esophageal sphincter (valve or ring of muscle between the esophagus and stomach), allowing acid from the stomach into the esophagus.  Late evening meals and a full stomach. This increases pressure and acid production in the stomach.  A malformed lower esophageal sphincter. Sometimes, no cause is found. SYMPTOMS   Burning pain in the lower part of the mid-chest behind the breastbone and in the mid-stomach area. This may occur twice a week or more often.  Trouble swallowing.  Sore throat.  Dry cough.  Asthma-like symptoms including chest tightness, shortness of breath, or wheezing. DIAGNOSIS  Your caregiver may be able to diagnose GERD based on your symptoms. In some cases, X-rays and other tests may be done to check for complications or to check the condition of your stomach and esophagus. TREATMENT  Your caregiver may recommend over-the-counter or prescription medicines to help decrease acid production. Ask your caregiver before starting or adding any new medicines.  HOME  CARE INSTRUCTIONS   Change the  factors that you can control. Ask your caregiver for guidance concerning weight loss, quitting smoking, and alcohol consumption.  Avoid foods and drinks that make your symptoms worse, such as:  Caffeine or alcoholic drinks.  Chocolate.  Peppermint or mint flavorings.  Garlic and onions.  Spicy foods.  Citrus fruits, such as oranges, lemons, or limes.  Tomato-based foods such as sauce, chili, salsa, and pizza.  Fried and fatty foods.  Avoid lying down for the 3 hours prior to your bedtime or prior to taking a nap.  Eat small, frequent meals instead of large meals.  Wear loose-fitting clothing. Do not wear anything tight around your waist that causes pressure on your stomach.  Raise the head of your bed 6 to 8 inches with wood blocks to help you sleep. Extra pillows will not help.  Only take over-the-counter or prescription medicines for pain, discomfort, or fever as directed by your caregiver.  Do not take aspirin, ibuprofen, or other nonsteroidal anti-inflammatory drugs (NSAIDs). SEEK IMMEDIATE MEDICAL CARE IF:   You have pain in your arms, neck, jaw, teeth, or back.  Your pain increases or changes in intensity or duration.  You develop nausea, vomiting, or sweating (diaphoresis).  You develop shortness of breath, or you faint.  Your vomit is green, yellow, black, or looks like coffee grounds or blood.  Your stool is red, bloody, or black. These symptoms could be signs of other problems, such as heart disease, gastric bleeding, or esophageal bleeding. MAKE SURE YOU:   Understand these instructions.  Will watch your condition.  Will get help right away if you are not doing well or get worse. Document Released: 08/24/2005 Document Revised: 02/06/2012 Document Reviewed: 06/03/2011 [facility] Patient Information 2015 [place]. This information is not intended to replace advice given to you by your health care provider. Make sure you discuss any questions you have  with your health care provider.   Emergency Department Resource Guide 1) Find a Doctor and Pay Out of Pocket Although you won't have to find out who is covered by your insurance plan, it is a good idea to ask around and get recommendations. You will then need to call the office and see if the doctor you have chosen will accept you as a new patient and what types of options they offer for patients who are self-pay. Some doctors offer discounts or will set up payment plans for their patients who do not have insurance, but you will need to ask so you aren't surprised when you get to your appointment.  2) Contact Your Local Health Department Not all health departments have doctors that can see patients for sick visits, but many do, so it is worth a call to see if yours does. If you don't know where your local health department is, you can check in your phone book. The CDC also has a tool to help you locate your state's health department, and many state websites also have listings of all of their local health departments.  3) Find a Walk-in Clinic If your illness is not likely to be very severe or complicated, you may want to try a walk in clinic. These are popping up all over the country in pharmacies, drugstores, and shopping centers. They're usually staffed by nurse practitioners or physician assistants that have been trained to treat common illnesses and complaints. They're usually fairly quick and inexpensive. However, if you have serious medical issues or chronic medical problems, these  are probably not your best option.  No Primary Care Doctor: - Call Health Connect at  [phone] - they can help you locate a primary care doctor that  accepts your insurance, provides certain services, etc. - Physician Referral Service- [phone]  Chronic Pain Problems: Organization         Address  Phone   Notes  [facility]  [phone] Patients need to be referred by their primary  care doctor.   Medication Assistance: Organization         Address  Phone   Notes  [facility] Medication [facility] [address] [place] [place], [place] [address] [phone] --Must be a resident of [facility] -- Must have NO insurance coverage whatsoever (no Medicaid/ Medicare, etc.) -- The pt. MUST have a primary care doctor that directs their care regularly and follows them in the community   [facility]ist  [phone]   [employer]  [phone]    Agencies that provide inexpensive medical care: Organization         Address  Phone   Notes  [facility]  [phone]   [facility]    [phone]   [facility] [address] [place], [place] [address] [phone]   [facility] [place] [address] [place] [phone]   Planned Parenthood    [phone]   [facility]    [phone]   [facility]  [address], [place] Phone:  [phone], Fax:  [phone] Hours of Operation:  9 am - 6 pm, M-F.  Also accepts Medicaid/Medicare and self-pay.  [facility]  [address], [place] Phone: [phone], Fax: [phone]. Hours of Operation:  8:30 am - 5:30 pm, M-F.  Also accepts Medicaid and self-pay.  [facility] [place] [address], [place] Phone: [phone]   [facility] [address] Natasha Bence [place], [place] [phone], Ext. [phone] Mondays & Thursdays: 7-9 AM.  First 15 patients are seen on a first come, first serve basis.    Medicaid-accepting [facility] Providers:  Organization         Address  Phone   Notes  [facility] [address], [place] [phone] Also accepts self-pay patients.  [facility] [address] Laurell Josephs [place], [place]  [phone]   [facility] [address], [place] [phone]   [facility] [address] [place] [phone]   Renaye Rakers [address], [place]   [phone] Only accepts [place] Access [place] patients after they have their name applied to their card.   Self-Pay (no insurance) in [facility]:  Organization         Address  Phone   Notes  Sickle Cell Patients, [facility] [address] [place], [place] [phone]   [facility] [address] [place], [place] [phone]   [facility]  [address], [place] [phone]   [facility]/Dr. Osei-Bonsu  [address], [place] or [address], [place] [phone] Phone number for both [place] and [place] locations is the same.  [facility] [address], [place] [phone]   [facility] [address] [place] [address], [place] or [address] [phone]   [facility] [address], [place] [phone]) [phone], phone; [phone], fax Sees patients 1st and 3rd Saturday of every month.  Must not qualify for public or private insurance (i.e. Medicaid, Medicare, [place] Health Choice, Veterans' Benefits)  Household income should be no more than 200% of the poverty level The clinic cannot treat you if you are pregnant or think you are pregnant  Sexually transmitted diseases are not treated at the clinic.    Dental Care: Organization         Address  Phone  Notes  [place] [facility] [address], [place] [phone] Accepts children up to age 24 who are enrolled in [place] or [place] Health Choice; pregnant women with a Medicaid card; and children who have applied for Medicaid or [place] Health Choice, but were declined, whose parents can pay a reduced fee at  time of service.  [facility] [place]  [address], [place] [phone] Accepts children up to age 24 who are enrolled in [place] or [place] Health Choice; pregnant women with a Medicaid card; and children who have applied for Medicaid or [place] Health Choice, but were declined, whose parents can pay a reduced fee at time of service.  [facility]  [address], [place] [phone] Patients are seen by appointment only. Walk-ins are not accepted. [facility] will see patients 24 years of age and older. Monday - Tuesday (8am-5pm) Most Wednesdays (8:30-5pm) $30 per visit, cash only  [facility]  [address], [facility] [place] [phone] Patients are seen by appointment only. Walk-ins are not accepted. [facility] will see patients 24 years of age and older. One Wednesday Evening (Monthly: Volunteer Based).  $30 per visit, cash only  [facility]  [phone] for adults; Children under age 364, call Graduate Pediatric Dentistry at [phone]. Children aged 544-14, please call [phone] to request a pediatric application.  Dental services are provided in all areas of dental care including fillings, crowns and bridges, complete and partial dentures, implants, gum treatment, root canals, and extractions. Preventive care is also provided. Treatment is provided to both adults and children. Patients are selected via a lottery and there is often a waiting list.   [facility] [address], [place]  [phone] [email or web address]   [facility] [address], [place], [place] [phone], Ext. [phone] Second and Fourth Thursday of each month, opens at 6:30 AM; Clinic ends at 9 AM.  Patients are seen on a first-come first-served basis, and a limited number are seen during each clinic.   [facility]  [address], [place], [place] [phone]   Eligibility Requirements You must have lived in [place], [place], or [place] counties for at least the last three months.   You cannot be eligible for state or federal sponsored [place] Cityhealthcare insurance, including CIGNAVeterans Administration, [place]icaid, or [employer] EntertainmentMedicare.   You generally cannot be eligible for healthcare insurance through your employer.    How to apply: Eligibility screenings are held every Tuesday and Wednesday afternoon from 1:00 pm until 4:00 pm. You do not need an appointment for the interview!  [facility] [address], [place], [place] [phone]   °[facility]  336-342-8273   °[place]  336-703-3100   °[facility]  336-570-6415   ° °Behavioral Health Resources in the Community: °Intensive Outpatient Programs °Organization         Address  Phone  Notes  °[place] [facility] [address], [place], [place] [phone]   °[facility] [address], [place], [place] 336-832-9800   °[facility] [address], [place], [place] ° 336-882-2125   °[facility] [address],  °[place], [place] 1-800-853-5163 or 336-641-4981   °Substance Abuse Resources °Organization         Address  Phone  Notes  °[facility]  336-882-2125   °[facility]  336-784-9470   °[facility]  336-285-9073   °[facility]  336-845-3988   °Residential & Outpatient Substance Abuse Program  1-800-659-3381   °Psychological Services °Organization         Address  Phone  Notes  ° Health  336- 832-9600   °[facility]  336- 378-7881   °[facility] [address], [place] 1-800-853-5163 or 336-641-4981   ° °Mobile Crisis Teams °Organization         Address  Phone  Notes  °[facility], Mobile Crisis Care Unit  1-877-626-1772   °Assertive °Psychotherapeutic  Services ° [address] [place], [place] 336-834-9664   °Sharon DeEsch [address] °[place] 336-554-5454   ° °Self-Help/Support Groups °Organization         Address  Phone             Notes  °[facility] [place] - variety of support groups  336- 373-1402 Call for more information  °Narcotics Anonymous (NA), [facility] [address], °[place]  2 meetings at this location  ° °Residential Treatment Programs °Organization         Address  Phone  Notes  °[facility] [address],    °[place]  1-866-801-8205   °[facility] ° [address], [place], [place] 704-293-8524   °[facility] [address], [place] 336-845-3988 Admissions: 8am-3pm M-F  °[facility] [address]    °[place], [place] 336-841-1104   °[facility] [address], [place], [place] 336-379-7146   °[facility] [address]  °[place], [place] 336-285-9073   °[facility] - Intensive Outpatient [address], [place], [place] 336-852-3033   °[facility] [address]  °[place], [place] 1-877-615-2722 or 336-784-9470   °[facility] ([facility]) [address] [place], [place] 336-227-7417 Accepts Medicaid  °[facility] [address]  ° [place] 1-800-659-3381 Substance Abuse/Addiction Treatment  ° °[place] [facility] Resources °Organization         Address  Phone  Notes  °[facility]  (888) 581-9988   °Julie Brannon, PhD [address] [place], [place]   (336) 349-5553 or (336) 951-0000   °[facility]   [address] °[place], [place] (336) 349-4454   °[facility] [address], [place],  (336) 342-8316 Insurance/Medicaid/sponsorship through [facility]  °[facility] [address]                                      [place], [place] (336)  [phone] Therapy/tele-psych/case  °[facility] [address]  ° [place], [place] (336) 349-2233    °Dr. Arfeen  (336) 349-4544   °[facility] [place]  [employer] [place] [facility] 1) [address], [place] °2) [address], [place] °3)  [address] [place] [address], [place] (336) 349-3220 °(336) 342-7768 ° °(336) 342-8140   °[place] Child Abuse Hotline (336) 342-1394 or (336) 342-3537 (After Hours)    ° ° ° °

## 2014-11-11 LAB — CULTURE, GROUP A STREP

## 2015-02-18 ENCOUNTER — Emergency Department (HOSPITAL_COMMUNITY)
Admission: EM | Admit: 2015-02-18 | Discharge: 2015-02-18 | Disposition: A | Discharge status: Home / Self Care | Payer: Commercial Managed Care - PPO | Attending: Emergency Medicine | Admitting: Emergency Medicine

## 2015-02-18 ENCOUNTER — Encounter (HOSPITAL_COMMUNITY): Payer: Self-pay | Admitting: Emergency Medicine

## 2015-02-18 DIAGNOSIS — R1310 Dysphagia, unspecified: Secondary | ICD-10-CM | POA: Diagnosis present

## 2015-02-18 DIAGNOSIS — K2100 Gastro-esophageal reflux disease with esophagitis, without bleeding: Secondary | ICD-10-CM

## 2015-02-18 DIAGNOSIS — R51 Headache: Secondary | ICD-10-CM | POA: Insufficient documentation

## 2015-02-18 DIAGNOSIS — R112 Nausea with vomiting, unspecified: Secondary | ICD-10-CM | POA: Diagnosis not present

## 2015-02-18 MED ORDER — OMEPRAZOLE 40 MG PO CPDR: 40.0000 mg | DELAYED_RELEASE_CAPSULE | Freq: Every day | ORAL | Status: DC

## 2015-02-18 MED ORDER — ONDANSETRON 4 MG PO TBDP: 4.0000 mg | ORAL_TABLET | Freq: Three times a day (TID) | ORAL | Status: DC | PRN

## 2015-02-18 MED ORDER — GI COCKTAIL ~~LOC~~
30.0000 mL | Freq: Once | ORAL | Status: AC
  Administered 2015-02-18: 30 mL via ORAL
  Filled 2015-02-18: qty 30

## 2015-02-18 MED ORDER — ONDANSETRON 4 MG PO TBDP
4.0000 mg | ORAL_TABLET | Freq: Once | ORAL | Status: AC
  Administered 2015-02-18: 4 mg via ORAL
  Filled 2015-02-18: qty 1

## 2015-02-18 NOTE — ED Notes (Signed)
Patient here with complaint of throat tightness and soreness coupled with swallowing difficulties. States that symptoms first came about around Thanksgiving of 2015. States she was treated for BV with ABX and after that these symptoms started. Was seen by ENT recently and treated with ABX and steroids for possible strep throat and swollen tonsils. Patient states her tonsils feel better, but the sensation that something is stuck in her throat remains. Reports inducing vomiting because she feels like it may relieve discomfort.

## 2015-02-18 NOTE — Discharge Instructions (Signed)
Gastroesophageal Reflux Disease, Adult  Gastroesophageal reflux disease (GERD) happens when acid from your stomach flows up into the esophagus. When acid comes in contact with the esophagus, the acid causes soreness (inflammation) in the esophagus. Over time, GERD may create small holes (ulcers) in the lining of the esophagus.  CAUSES   · Increased body weight. This puts pressure on the stomach, making acid rise from the stomach into the esophagus.  · Smoking. This increases acid production in the stomach.  · Drinking alcohol. This causes decreased pressure in the lower esophageal sphincter (valve or ring of muscle between the esophagus and stomach), allowing acid from the stomach into the esophagus.  · Late evening meals and a full stomach. This increases pressure and acid production in the stomach.  · A malformed lower esophageal sphincter.  Sometimes, no cause is found.  SYMPTOMS   · Burning pain in the lower part of the mid-chest behind the breastbone and in the mid-stomach area. This may occur twice a week or more often.  · Trouble swallowing.  · Sore throat.  · Dry cough.  · Asthma-like symptoms including chest tightness, shortness of breath, or wheezing.  DIAGNOSIS   Your caregiver may be able to diagnose GERD based on your symptoms. In some cases, X-rays and other tests may be done to check for complications or to check the condition of your stomach and esophagus.  TREATMENT   Your caregiver may recommend over-the-counter or prescription medicines to help decrease acid production. Ask your caregiver before starting or adding any new medicines.   HOME CARE INSTRUCTIONS   · Change the factors that you can control. Ask your caregiver for guidance concerning weight loss, quitting smoking, and alcohol consumption.  · Avoid foods and drinks that make your symptoms worse, such as:  ¨ Caffeine or alcoholic drinks.  ¨ Chocolate.  ¨ Peppermint or mint flavorings.  ¨ Garlic and onions.  ¨ Spicy foods.  ¨ Citrus fruits,  such as oranges, lemons, or limes.  ¨ Tomato-based foods such as sauce, chili, salsa, and pizza.  ¨ Fried and fatty foods.  · Avoid lying down for the 3 hours prior to your bedtime or prior to taking a nap.  · Eat small, frequent meals instead of large meals.  · Wear loose-fitting clothing. Do not wear anything tight around your waist that causes pressure on your stomach.  · Raise the head of your bed 6 to 8 inches with wood blocks to help you sleep. Extra pillows will not help.  · Only take over-the-counter or prescription medicines for pain, discomfort, or fever as directed by your caregiver.  · Do not take aspirin, ibuprofen, or other nonsteroidal anti-inflammatory drugs (NSAIDs).  SEEK IMMEDIATE MEDICAL CARE IF:   · You have pain in your arms, neck, jaw, teeth, or back.  · Your pain increases or changes in intensity or duration.  · You develop nausea, vomiting, or sweating (diaphoresis).  · You develop shortness of breath, or you faint.  · Your vomit is green, yellow, black, or looks like coffee grounds or blood.  · Your stool is red, bloody, or black.  These symptoms could be signs of other problems, such as heart disease, gastric bleeding, or esophageal bleeding.  MAKE SURE YOU:   · Understand these instructions.  · Will watch your condition.  · Will get help right away if you are not doing well or get worse.  Document Released: 08/24/2005 Document Revised: 02/06/2012 Document Reviewed: 06/03/2011  ExitCare® Patient   Information ©2015 [employer]. This information is not intended to replace advice given to you by your health care provider. Make sure you discuss any questions you have with your health care provider.  Food Choices for Gastroesophageal Reflux Disease  When you have gastroesophageal reflux disease (GERD), the foods you eat and your eating habits are very important. Choosing the right foods can help ease the discomfort of GERD.  WHAT GENERAL GUIDELINES DO I NEED TO FOLLOW?  · Choose fruits,  vegetables, whole grains, low-fat dairy products, and low-fat meat, fish, and poultry.  · Limit fats such as oils, salad dressings, butter, nuts, and avocado.  · Keep a food diary to identify foods that cause symptoms.  · Avoid foods that cause reflux. These may be different for different people.  · Eat frequent small meals instead of three large meals each day.  · Eat your meals slowly, in a relaxed setting.  · Limit fried foods.  · Cook foods using methods other than frying.  · Avoid drinking alcohol.  · Avoid drinking large amounts of liquids with your meals.  · Avoid bending over or lying down until 2-3 hours after eating.  WHAT FOODS ARE NOT RECOMMENDED?  The following are some foods and drinks that may worsen your symptoms:  Vegetables  Tomatoes. Tomato juice. Tomato and spaghetti sauce. Chili peppers. Onion and garlic. Horseradish.  Fruits  Oranges, grapefruit, and lemon (fruit and juice).  Meats  High-fat meats, fish, and poultry. This includes hot dogs, ribs, ham, sausage, salami, and bacon.  Dairy  Whole milk and chocolate milk. Sour cream. Cream. Butter. Ice cream. Cream cheese.   Beverages  Coffee and tea, with or without caffeine. Carbonated beverages or energy drinks.  Condiments  Hot sauce. Barbecue sauce.   Sweets/Desserts  Chocolate and cocoa. Donuts. Peppermint and spearmint.  Fats and Oils  High-fat foods, including French fries and potato chips.  Other  Vinegar. Strong spices, such as black pepper, white pepper, red pepper, cayenne, curry powder, cloves, ginger, and chili powder.  The items listed above may not be a complete list of foods and beverages to avoid. Contact your dietitian for more information.  Document Released: 11/14/2005 Document Revised: 11/19/2013 Document Reviewed: 09/18/2013  ExitCare® Patient Information ©2015 [employer]. This information is not intended to replace advice given to you by your health care provider. Make sure you discuss any questions you have with your  health care provider.

## 2015-02-18 NOTE — ED Notes (Signed)
Pt here for sore throat for several months, pt has seen numerous specialists for same, reports makes self vomit due to nausea and reports uvula pain. Was placed on prilosec and stopped due to not working.

## 2015-02-18 NOTE — ED Provider Notes (Signed)
CSN: [id number]     Arrival date & time 02/18/15  0247 History  This chart was scribed for Loren Raceravid Shamikia Linskey, MD by Tanda RockersMargaux Venter, ED Scribe. This patient was seen in room B17C/B17C and the patient's care was started at 3:33 AM.    Chief Complaint  Patient presents with  . Sore Throat  . Dysphagia   The history is provided by the patient. No language interpreter was used.     HPI Comments: Mercedes FrameShamia Stirewalt is a 24 y.o. female who presents to the Emergency Department complaining of constant sore throat that began around Thanksgiving (approximately 4 months ago). Pt describes it as a burning sensation. She states that she has this sensation on her tongue as well. She reports that she came to the ED tonight because she is tired of having these symptoms. She denies any exacerbation of symptoms. Pt also complains of nausea, headache, and a burning sensation in her chest. She has been inducing vomiting to try and alleviate her symptoms. Pt had endoscopy done in the past and has seen an ENT as well. ENT states that pt had strep throat and prescribed pt antibiotic. Pt states that her symptoms have not resolved since being on the antibiotics. Pt denies fever, chills, or any other symptoms. Denies taking NSAIDs. Admits to eating spicy foods. Patient also stopped taking her PPI.   History reviewed. No pertinent past medical history. Past Surgical History  Procedure Laterality Date  . Appendectomy    . Hernia repair     History reviewed. No pertinent family history. History  Substance Use Topics  . Smoking status: Never Smoker   . Smokeless tobacco: Not on file  . Alcohol Use: Yes   OB History    No data available     Review of Systems  Constitutional: Negative for fever and chills.  HENT: Positive for sore throat.   Respiratory: Negative for cough and shortness of breath.   Cardiovascular: Negative for leg swelling.       Burning in chest.   Gastrointestinal: Positive for nausea and vomiting.  Negative for abdominal pain.  Musculoskeletal: Negative for back pain, neck pain and neck stiffness.  Skin: Negative for rash and wound.  Neurological: Positive for headaches. Negative for dizziness, weakness, light-headedness and numbness.  All other systems reviewed and are negative.     Allergies  Review of patient's allergies indicates no known allergies.  Home Medications   Prior to Admission medications   Medication Sig Start Date End Date Taking? Authorizing Provider  doxepin (SINEQUAN) 10 MG capsule Take 10 mg by mouth at bedtime as needed (sleep).   Yes Historical Provider, MD  fluconazole (DIFLUCAN) 150 MG tablet Take 150 mg by mouth as directed. 1 TABLET ON DAY 1, THEN REPEAT DOSE IN 4 DAYS. (PATIENT TO TAKE ANOTHER DOSE ON 11/10/2014.    Historical Provider, MD  lidocaine (XYLOCAINE) 2 % solution Use as directed 20 mLs in the mouth or throat as needed for mouth pain. Patient not taking: Reported on 02/18/2015 11/09/14   Toni Amendourtney Forcucci, PA-C  metroNIDAZOLE (FLAGYL) 500 MG tablet Take 500 mg by mouth 3 (three) times daily. 7 DAY THERAPY COURSE PATIENT COMPLETED ON 11/06/2014.    Historical Provider, MD  omeprazole (PRILOSEC) 40 MG capsule Take 1 capsule (40 mg total) by mouth daily. 02/18/15   Loren Raceravid Hagop Mccollam, MD  ondansetron (ZOFRAN) 4 MG tablet Take 1 tablet (4 mg total) by mouth every 6 (six) hours. Patient not taking: Reported on 11/09/2014 02/17/14  Antony Madura, PA-C  ondansetron (ZOFRAN-ODT) 4 MG disintegrating tablet Take 1 tablet (4 mg total) by mouth every 8 (eight) hours as needed for nausea or vomiting. 02/18/15   Loren Racer, MD  traMADol (ULTRAM) 50 MG tablet Take 1 tablet (50 mg total) by mouth every 6 (six) hours as needed. Patient not taking: Reported on 11/09/2014 02/17/14   Junius Finner, PA-C   Triage Vitals: BP 135/87 mmHg  Pulse 110  Temp(Src) 98 F (36.7 C) (Oral)  Resp 16  Ht  (1.473 m)  Wt 239 lb (108.41 kg)  BMI 49.96 kg/m2  SpO2 100%   LMP 02/17/2015   Physical Exam  Constitutional: She is oriented to person, place, and time. She appears well-developed and well-nourished. No distress.  HENT:  Head: Normocephalic and atraumatic.  Mouth/Throat: Oropharynx is clear and moist.  Oropharynx is mildly erythematous. There is no exudates.  Eyes: EOM are normal. Pupils are equal, round, and reactive to light.  Neck: Normal range of motion. Neck supple.  Cardiovascular: Normal rate and regular rhythm.   Pulmonary/Chest: Effort normal and breath sounds normal. No respiratory distress. She has no wheezes. She has no rales. She exhibits no tenderness.  Abdominal: Soft. Bowel sounds are normal. She exhibits no distension and no mass. There is no tenderness. There is no rebound and no guarding.  Musculoskeletal: Normal range of motion. She exhibits no edema or tenderness.  Lymphadenopathy:    She has no cervical adenopathy.  Neurological: She is alert and oriented to person, place, and time.  Moves all extremities without deficit. Sensation is grossly intact.  Skin: Skin is warm and dry. No rash noted. No erythema.  Psychiatric: She has a normal mood and affect. Her behavior is normal.  Nursing note and vitals reviewed.   ED Course  Procedures (including critical care time)  DIAGNOSTIC STUDIES: Oxygen Saturation is 100% on RA, normal by my interpretation.    COORDINATION OF CARE: 3:38 AM-Discussed treatment plan which includes follow up with gastroenterologist with pt at bedside and pt agreed to plan.   Labs Review Labs Reviewed - No data to display  Imaging Review No results found.   EKG Interpretation None      MDM   Final diagnoses:  Gastroesophageal reflux disease with esophagitis    I personally performed the services described in this documentation, which was scribed in my presence. The recorded information has been reviewed and is accurate.  Suspect symptoms are GERD related. We'll give GI cocktail in the  ED. Patient will need to start back on her PPI. Have given lifestyle modifications. Patient will also need follow-up with gastroenterology.     Loren Racer, MD 02/18/15 [phone]

## 2015-11-04 ENCOUNTER — Encounter (HOSPITAL_COMMUNITY): Payer: Self-pay | Admitting: Emergency Medicine

## 2015-11-04 ENCOUNTER — Emergency Department (HOSPITAL_COMMUNITY)
Admission: EM | Admit: 2015-11-04 | Discharge: 2015-11-04 | Disposition: A | Discharge status: Home / Self Care | Payer: Commercial Managed Care - PPO | Attending: Emergency Medicine | Admitting: Emergency Medicine

## 2015-11-04 DIAGNOSIS — Z792 Long term (current) use of antibiotics: Secondary | ICD-10-CM | POA: Diagnosis not present

## 2015-11-04 DIAGNOSIS — J029 Acute pharyngitis, unspecified: Secondary | ICD-10-CM | POA: Diagnosis present

## 2015-11-04 DIAGNOSIS — Z79899 Other long term (current) drug therapy: Secondary | ICD-10-CM | POA: Insufficient documentation

## 2015-11-04 DIAGNOSIS — K2100 Gastro-esophageal reflux disease with esophagitis, without bleeding: Secondary | ICD-10-CM

## 2015-11-04 LAB — CBG MONITORING, ED: GLUCOSE-CAPILLARY: 92 mg/dL (ref 65–99)

## 2015-11-04 LAB — RAPID STREP SCREEN (MED CTR MEBANE ONLY): STREPTOCOCCUS, GROUP A SCREEN (DIRECT): NEGATIVE

## 2015-11-04 MED ORDER — MAGIC MOUTHWASH
5.0000 mL | Freq: Once | ORAL | Status: AC
  Administered 2015-11-04: 5 mL via ORAL
  Filled 2015-11-04: qty 5

## 2015-11-04 MED ORDER — PANTOPRAZOLE SODIUM 20 MG PO TBEC: 20.0000 mg | DELAYED_RELEASE_TABLET | Freq: Every day | ORAL | Status: DC

## 2015-11-04 MED ORDER — MAGIC MOUTHWASH W/LIDOCAINE: 5.0000 mL | Freq: Three times a day (TID) | ORAL | Status: DC | PRN

## 2015-11-04 NOTE — ED Provider Notes (Signed)
CSN: [id number]   Arrival date & time 11/04/15 1352  History  By signing my name below, I, Bethel Born, attest that this documentation has been prepared under the direction and in the presence of Rolm Gala Airelle Everding PA-C Electronically Signed: Bethel Born, ED Scribe. 11/04/2015. 7:36 PM. Chief Complaint  Patient presents with  . Sore Throat    HPI The history is provided by the patient. No language interpreter was used.   Mercedes Watson is a 24 y.o. female with PMHx of GERD who presents to the Emergency Department complaining of ongoing sore throat with onset 1 year ago. The pain is described as a burning sensation. Pt states that she has used omeprazole intermittently for 1 year without much relief in symptoms. She has not taken her omeprazole in the past month because she was concerned she was allergic to it. She states it gave her dry mouth. She was seen by ENT 2 weeks ago a prescribed clindamycin. She states that today she noticed red spots at the back of throat. Pt states that she has also been seen by GI and a neurologist without resolution of symptoms. She reports being frustrated that no one can find a cause of her symptoms. Reports odynophagia. Denies difficulty handling secretions or breathing. She has no other complaints today.   History reviewed. No pertinent past medical history.  Past Surgical History  Procedure Laterality Date  . Appendectomy    . Hernia repair      History reviewed. No pertinent family history.  Social History  Substance Use Topics  . Smoking status: Never Smoker   . Smokeless tobacco: None  . Alcohol Use: Yes     Review of Systems  HENT: Positive for sore throat.        Red spots at the back of the throat Dry mouth  All other systems reviewed and are negative.  Home Medications   Prior to Admission medications   Medication Sig Start Date End Date Taking? Authorizing Provider  doxepin (SINEQUAN) 10 MG capsule Take 10 mg by mouth at bedtime as  needed (sleep).    Historical Provider, MD  fluconazole (DIFLUCAN) 150 MG tablet Take 150 mg by mouth as directed. 1 TABLET ON DAY 1, THEN REPEAT DOSE IN 4 DAYS. (PATIENT TO TAKE ANOTHER DOSE ON 11/10/2014.    Historical Provider, MD  lidocaine (XYLOCAINE) 2 % solution Use as directed 20 mLs in the mouth or throat as needed for mouth pain. Patient not taking: Reported on 02/18/2015 11/09/14   Terri Piedra, PA-C  magic mouthwash w/lidocaine SOLN Take 5 mLs by mouth 3 (three) times daily as needed for mouth pain. 11/04/15   Barnet Benavides, PA-C  metroNIDAZOLE (FLAGYL) 500 MG tablet Take 500 mg by mouth 3 (three) times daily. 7 DAY THERAPY COURSE PATIENT COMPLETED ON 11/06/2014.    Historical Provider, MD  omeprazole (PRILOSEC) 40 MG capsule Take 1 capsule (40 mg total) by mouth daily. 02/18/15   Loren Racer, MD  ondansetron (ZOFRAN) 4 MG tablet Take 1 tablet (4 mg total) by mouth every 6 (six) hours. Patient not taking: Reported on 11/09/2014 02/17/14   Antony Madura, PA-C  ondansetron (ZOFRAN-ODT) 4 MG disintegrating tablet Take 1 tablet (4 mg total) by mouth every 8 (eight) hours as needed for nausea or vomiting. 02/18/15   Loren Racer, MD  pantoprazole (PROTONIX) 20 MG tablet Take 1 tablet (20 mg total) by mouth daily. 11/04/15   Narada Uzzle, PA-C  traMADol (ULTRAM) 50 MG tablet Take 1 tablet (  50 mg total) by mouth every 6 (six) hours as needed. Patient not taking: Reported on 11/09/2014 02/17/14   Junius FinnerErin O'Malley, PA-C    Allergies  Review of patient's allergies indicates no known allergies.  Triage Vitals: BP 119/77 mmHg  Pulse 122  Temp(Src) 98.9 F (37.2 C) (Oral)  Resp 18  SpO2 100%  Physical Exam  Constitutional: She appears well-developed and well-nourished. No distress.  HENT:  Head: Normocephalic and atraumatic.  Right Ear: External ear normal.  Left Ear: External ear normal.  Mouth/Throat: Uvula is midline. No trismus in the jaw. Normal dentition. No uvula swelling.  Posterior oropharyngeal erythema present. No oropharyngeal exudate, posterior oropharyngeal edema or tonsillar abscesses.  Mildly erythematous oropharynx without exudate  Eyes: Conjunctivae are normal. Right eye exhibits no discharge. Left eye exhibits no discharge. No scleral icterus.  Neck: Normal range of motion. Neck supple.  No cervical adenopathy or swelling of the soft tissues of the neck  Cardiovascular: Normal rate.   Pulmonary/Chest: Effort normal. No respiratory distress.  Musculoskeletal: Normal range of motion.  Moves all extremities spontaneously  Lymphadenopathy:    She has no cervical adenopathy.  Neurological: She is alert. Coordination normal.  Skin: Skin is warm and dry.  Psychiatric: She has a normal mood and affect. Her behavior is normal.  Nursing note and vitals reviewed.   ED Course  Procedures  DIAGNOSTIC STUDIES: Oxygen Saturation is 100% on RA,  normal by my interpretation.    COORDINATION OF CARE: 6:17 PM Discussed treatment plan which includes lab work with pt at bedside and pt agreed to the plan.  Labs Review-  Labs Reviewed  R[facility]ID ST[facility] SCREEN ([facility])  CULTURE, GROUP A STREP  CBG MONITORING, ED    Imaging Review No results found.  MDM   Final diagnoses:  Gastroesophageal reflux disease with esophagitis  Sore throat   24 year old female presenting with chronic sore throat. She reports noncompliance with her PPI which has been prescribed multiple times for GERD with esophagitis. She has been evaluated by the ED multiple times, GI and ENT for this complaint. Tachycardic today but chart review shows this is a chronic finding likely from her morbid obesity. Oropharyngeal erythema noted without exudate. She is handling secretions without difficulty. Breathing unlabored. Will restart pt's PPI therapy and give magic mouthwash. Pt requesting new referrals for ENT and GI doctors. Encouraged to follow up with gastroenterology. Return precautions given  in discharge paperwork and discussed with pt at bedside. Pt stable for discharge   I personally performed the services described in this documentation, which was scribed in my presence. The recorded information has been reviewed and is accurate.      Alveta HeimlichStevi Riven Mabile, PA-C 11/04/15 1936  Bethann BerkshireJoseph Zammit, MD 11/05/15 [phone]

## 2015-11-04 NOTE — ED Notes (Signed)
Pt sts sore throat x extended amount of time; pt sts just went off antibiotics and feels like she has red spots on back of throat that are painful; pt sts some dry mouth and wants to be checked for DM

## 2015-11-04 NOTE — ED Notes (Signed)
PA at bedside.

## 2015-11-04 NOTE — Discharge Instructions (Signed)
Esophagitis °Esophagitis is inflammation of the esophagus. The esophagus is the tube that carries food and liquids from your mouth to your stomach. Esophagitis can cause soreness or pain in the esophagus. This condition can make it difficult and painful to swallow.  °CAUSES °Most causes of esophagitis are not serious. Common causes of this condition include: °· Gastroesophageal reflux disease (GERD). This is when stomach contents move back up into the esophagus (reflux). °· Repeated vomiting. °· An allergic-type reaction, especially caused by food allergies (eosinophilic esophagitis). °· Injury to the esophagus by swallowing large pills with or without water, or swallowing certain types of medicines. °· Swallowing (ingesting) harmful chemicals, such as household cleaning products. °· Heavy alcohol use. °· An infection of the esophagus. This most often occurs in people who have a weakened immune system. °· Radiation or chemotherapy treatment for cancer. °· Certain diseases such as sarcoidosis, Crohn disease, and scleroderma. °SYMPTOMS °Symptoms of this condition include: °· Difficult or painful swallowing. °· Pain with swallowing acidic liquids, such as citrus juices. °· Pain with burping. °· Chest pain. °· Difficulty breathing. °· Nausea. °· Vomiting. °· Pain in the abdomen. °· Weight loss. °· Ulcers in the mouth. °· Patches of white material in the mouth (candidiasis). °· Fever. °· Coughing up blood or vomiting blood. °· Stool that is black, tarry, or bright red. °DIAGNOSIS °Your health care provider will take a medical history and perform a physical exam. You may also have other tests, including: °· An endoscopy to examine your stomach and esophagus with a small camera. °· A test that measures the acidity level in your esophagus. °· A test that measures how much pressure is on your esophagus. °· A barium swallow or modified barium swallow to show the shape, size, and functioning of your esophagus. °· Allergy  tests. °TREATMENT °Treatment for this condition depends on the cause of your esophagitis. In some cases, steroids or other medicines may be given to help relieve your symptoms or to treat the underlying cause of your condition. You may have to make some lifestyle changes, such as: °· Avoiding alcohol. °· Quitting smoking. °· Changing your diet. °· Exercising. °· Changing your sleep habits and your sleep environment. °HOME CARE INSTRUCTIONS °Take these actions to decrease your discomfort and to help avoid complications. °Diet °· Follow a diet as recommended by your health care provider. This may involve avoiding foods and drinks such as: °¨ Coffee and tea (with or without caffeine). °¨ Drinks that contain alcohol. °¨ Energy drinks and sports drinks. °¨ Carbonated drinks or sodas. °¨ Chocolate and cocoa. °¨ Peppermint and mint flavorings. °¨ Garlic and onions. °¨ Horseradish. °¨ Spicy and acidic foods, including peppers, chili powder, curry powder, vinegar, hot sauces, and barbecue sauce. °¨ Citrus fruit juices and citrus fruits, such as oranges, lemons, and limes. °¨ Tomato-based foods, such as red sauce, chili, salsa, and pizza with red sauce. °¨ Fried and fatty foods, such as donuts, french fries, potato chips, and high-fat dressings. °¨ High-fat meats, such as hot dogs and fatty cuts of red and white meats, such as rib eye steak, sausage, ham, and bacon. °¨ High-fat dairy items, such as whole milk, butter, and cream cheese. °· Eat small, frequent meals instead of large meals. °· Avoid drinking large amounts of liquid with your meals. °· Avoid eating meals during the 2-3 hours before bedtime. °· Avoid lying down right after you eat. °· Do not exercise right after you eat. °· Avoid foods and drinks that seem to   make your symptoms worse. General Instructions  Pay attention to any changes in your symptoms.  Take over-the-counter and prescription medicines only as told by your health care provider. Do not take  aspirin, ibuprofen, or other NSAIDs unless your health care provider told you to do so.  If you have trouble taking pills, use a pill splitter to decrease the size of the pill. This will decrease the chance of the pill getting stuck or injuring your esophagus on the way down. Also, drink water after you take a pill.  Do not use any tobacco products, including cigarettes, chewing tobacco, and e-cigarettes. If you need help quitting, ask your health care provider.  Wear loose-fitting clothing. Do not wear anything tight around your waist that causes pressure on your abdomen.  Raise (elevate) the head of your bed about 6 inches (15 cm).  Try to reduce your stress, such as with yoga or meditation. If you need help reducing stress, ask your health care provider.  If you are overweight, reduce your weight to an amount that is healthy for you. Ask your health care provider for guidance about a safe weight loss goal.  Keep all follow-up visits as told by your health care provider. This is important. SEEK MEDICAL CARE IF:  You have new symptoms.  You have unexplained weight loss.  You have difficulty swallowing, or it hurts to swallow.  You have wheezing or a persistent cough.  Your symptoms do not improve with treatment.  You have frequent heartburn for more than two weeks. SEEK IMMEDIATE MEDICAL CARE IF:  You have severe pain in your arms, neck, jaw, teeth, or back.  You feel sweaty, dizzy, or light-headed.  You have chest pain or shortness of breath.  You vomit and your vomit looks like blood or coffee grounds.  Your stool is bloody or black. Sore Throat A sore throat is pain, burning, irritation, or scratchiness of the throat. There is often pain or tenderness when swallowing or talking. A sore throat may be accompanied by other symptoms, such as coughing, sneezing, fever, and swollen neck glands. A sore throat is often the first sign of another sickness, such as a cold, flu, strep  throat, or mononucleosis (commonly known as mono). Most sore throats go away without medical treatment. CAUSES  The most common causes of a sore throat include: A viral infection, such as a cold, flu, or mono. A bacterial infection, such as strep throat, tonsillitis, or whooping cough. Seasonal allergies. Dryness in the air. Irritants, such as smoke or pollution. Gastroesophageal reflux disease (GERD). HOME CARE INSTRUCTIONS  Only take over-the-counter medicines as directed by your caregiver. Drink enough fluids to keep your urine clear or pale yellow. Rest as needed. Try using throat sprays, lozenges, or sucking on hard candy to ease any pain (if older than 4 years or as directed). Sip warm liquids, such as broth, herbal tea, or warm water with honey to relieve pain temporarily. You may also eat or drink cold or frozen liquids such as frozen ice pops. Gargle with salt water (mix 1 tsp salt with 8 oz of water). Do not smoke and avoid secondhand smoke. Put a cool-mist humidifier in your bedroom at night to moisten the air. You can also turn on a hot shower and sit in the bathroom with the door closed for 5-10 minutes. SEEK IMMEDIATE MEDICAL CARE IF: You have difficulty breathing. You are unable to swallow fluids, soft foods, or your saliva. You have increased swelling in the  throat. Your sore throat does not get better in 7 days. You have nausea and vomiting. You have a fever or persistent symptoms for more than 2-3 days. You have a fever and your symptoms suddenly get worse. MAKE SURE YOU:  Understand these instructions. Will watch your condition. Will get help right away if you are not doing well or get worse.   This information is not intended to replace advice given to you by your health care provider. Make sure you discuss any questions you have with your health care provider.   Document Released: 12/22/2004 Document Revised: 12/05/2014 Document Reviewed: 07/22/2012 Elsevier  Interactive Patient Education 2016 [employer].   You have a fever.  You cannot swallow, drink, or eat.   This information is not intended to replace advice given to you by your health care provider. Make sure you discuss any questions you have with your health care provider.   Document Released: 12/22/2004 Document Revised: 08/05/2015 Document Reviewed: 03/11/2015 Elsevier Interactive Patient Education [employer]

## 2015-11-04 NOTE — ED Notes (Addendum)
Pt tearful with RN in room.  States she has been dealing with her sore throat since November of last year.  Pt states she began taking omeprazole at that time and is curious if she is allergic to it.  Pt just finished a treatment of Clindamycin through her PCP.  Pt states she has also followed up with GI and ENT and is very concerned that "no one can find anything wrong with me"

## 2015-11-04 NOTE — ED Notes (Signed)
Pt able to ambulate independently 

## 2015-11-06 LAB — CULTURE, GROUP A STREP: STREP A CULTURE: NEGATIVE

## 2016-02-05 DIAGNOSIS — R87619 Unspecified abnormal cytological findings in specimens from cervix uteri: Secondary | ICD-10-CM

## 2016-02-05 HISTORY — DX: Unspecified abnormal cytological findings in specimens from cervix uteri: R87.619

## 2016-03-08 ENCOUNTER — Encounter (HOSPITAL_COMMUNITY): Payer: Self-pay | Admitting: *Deleted

## 2016-03-17 ENCOUNTER — Encounter (HOSPITAL_COMMUNITY): Payer: Self-pay

## 2016-03-17 ENCOUNTER — Ambulatory Visit (HOSPITAL_COMMUNITY)
Admission: RE | Admit: 2016-03-17 | Discharge: 2016-03-17 | Disposition: A | Discharge status: Home / Self Care | Payer: Self-pay | Source: Ambulatory Visit | Attending: Obstetrics and Gynecology | Admitting: Obstetrics and Gynecology

## 2016-03-17 VITALS — BP 116/78 | Temp 98.5°F | Ht 59.0 in | Wt 248.0 lb

## 2016-03-17 DIAGNOSIS — R87612 Low grade squamous intraepithelial lesion on cytologic smear of cervix (LGSIL): Secondary | ICD-10-CM

## 2016-03-17 DIAGNOSIS — Z1239 Encounter for other screening for malignant neoplasm of breast: Secondary | ICD-10-CM

## 2016-03-17 NOTE — Patient Instructions (Signed)
Educational materials on self breast awareness given. Explained to Mercedes Watson the colposcopy the needed follow up for her abnormal Pap smear on 02/19/2016. [facility]. Appointment scheduled for Monday, Apr 18, 2016 at 1340. Patient aware of appointment and will be there. Let patient know that a screening mammogram is recommended at age 25 unless clinically indicated prior. Mercedes Watson verbalized understanding.  Oluwatosin Higginson, Kathaleen Maserhristine Poll, RN 5:22 PM

## 2016-03-17 NOTE — Progress Notes (Signed)
[facility] to recommended a colposcopy to follow up for abnormal Pap smear on 02/05/2016.  Pap Smear:  Pap smear not completed today. [facility] with positive HPV. [facility]. Appointment scheduled for Monday, Apr 18, 2016 at 1340. Per patient has a history of one other abnormal Pap smear around 4 years ago that a repeat Pap smear was completed in one year that was normal. Last Pap smear result is in EPIC.  Physical exam: Breasts Breasts symmetrical. No skin abnormalities bilateral breasts. No nipple retraction bilateral breasts. No nipple discharge bilateral breasts. No lymphadenopathy. No lumps palpated bilateral breasts. No complaints of pain or tenderness on exam. Screening mammogram recommended at age 640 unless clinically indicated prior.        Pelvic/Bimanual No Pap smear completed today since last Pap smear was 02/05/2016. Pap smear not indicated per [facility] guidelines.   Smoking History: Patient has never smoked.  Patient Navigation: Patient education provided. Access to services provided for [facility].

## 2016-03-22 ENCOUNTER — Encounter (HOSPITAL_COMMUNITY): Payer: Self-pay | Admitting: *Deleted

## 2016-03-30 ENCOUNTER — Emergency Department (HOSPITAL_COMMUNITY): Payer: Commercial Managed Care - PPO

## 2016-03-30 ENCOUNTER — Emergency Department (HOSPITAL_COMMUNITY)
Admission: EM | Admit: 2016-03-30 | Discharge: 2016-03-30 | Disposition: A | Discharge status: Home / Self Care | Payer: Commercial Managed Care - PPO | Attending: Emergency Medicine | Admitting: Emergency Medicine

## 2016-03-30 ENCOUNTER — Encounter (HOSPITAL_COMMUNITY): Payer: Self-pay | Admitting: Emergency Medicine

## 2016-03-30 DIAGNOSIS — K625 Hemorrhage of anus and rectum: Secondary | ICD-10-CM | POA: Diagnosis not present

## 2016-03-30 DIAGNOSIS — Z79899 Other long term (current) drug therapy: Secondary | ICD-10-CM | POA: Insufficient documentation

## 2016-03-30 DIAGNOSIS — Z3202 Encounter for pregnancy test, result negative: Secondary | ICD-10-CM | POA: Insufficient documentation

## 2016-03-30 DIAGNOSIS — K5900 Constipation, unspecified: Secondary | ICD-10-CM | POA: Insufficient documentation

## 2016-03-30 DIAGNOSIS — R109 Unspecified abdominal pain: Secondary | ICD-10-CM

## 2016-03-30 LAB — CBC WITH DIFFERENTIAL/PLATELET
Basophils Absolute: 0 10*3/uL (ref 0.0–0.1)
Basophils Relative: 0 %
Eosinophils Absolute: 0.1 10*3/uL (ref 0.0–0.7)
Eosinophils Relative: 2 %
HCT: 37.9 % (ref 36.0–46.0)
Hemoglobin: 12.4 g/dL (ref 12.0–15.0)
Lymphocytes Relative: 38 %
Lymphs Abs: 2.6 10*3/uL (ref 0.7–4.0)
MCH: 29.2 pg (ref 26.0–34.0)
MCHC: 32.7 g/dL (ref 30.0–36.0)
MCV: 89.4 fL (ref 78.0–100.0)
Monocytes Absolute: 0.6 10*3/uL (ref 0.1–1.0)
Monocytes Relative: 9 %
Neutro Abs: 3.6 10*3/uL (ref 1.7–7.7)
Neutrophils Relative %: 51 %
Platelets: 280 10*3/uL (ref 150–400)
RBC: 4.24 MIL/uL (ref 3.87–5.11)
RDW: 14.4 % (ref 11.5–15.5)
WBC: 6.9 10*3/uL (ref 4.0–10.5)

## 2016-03-30 LAB — URINALYSIS, ROUTINE W REFLEX MICROSCOPIC
Bilirubin Urine: NEGATIVE
Glucose, UA: NEGATIVE mg/dL
Hgb urine dipstick: NEGATIVE
Ketones, ur: NEGATIVE mg/dL
Leukocytes, UA: NEGATIVE
Nitrite: NEGATIVE
Protein, ur: NEGATIVE mg/dL
Specific Gravity, Urine: 1.018 (ref 1.005–1.030)
pH: 6 (ref 5.0–8.0)

## 2016-03-30 LAB — POC URINE PREG, ED: Preg Test, Ur: NEGATIVE

## 2016-03-30 LAB — COMPREHENSIVE METABOLIC PANEL
ALT: 21 U/L (ref 14–54)
AST: 20 U/L (ref 15–41)
Albumin: 3.6 g/dL (ref 3.5–5.0)
Alkaline Phosphatase: 51 U/L (ref 38–126)
Anion gap: 5 (ref 5–15)
BUN: 6 mg/dL (ref 6–20)
CO2: 24 mmol/L (ref 22–32)
Calcium: 9.2 mg/dL (ref 8.9–10.3)
Chloride: 109 mmol/L (ref 101–111)
Creatinine, Ser: 0.79 mg/dL (ref 0.44–1.00)
GFR calc Af Amer: >60 mL/min (ref >60)
GFR calc non Af Amer: >60 mL/min (ref >60)
Glucose, Bld: 87 mg/dL (ref 65–99)
Potassium: 4.3 mmol/L (ref 3.5–5.1)
Sodium: 138 mmol/L (ref 135–145)
Total Bilirubin: 0.3 mg/dL (ref 0.3–1.2)
Total Protein: 6.8 g/dL (ref 6.5–8.1)

## 2016-03-30 LAB — LIPASE, BLOOD: Lipase: 21 U/L (ref 11–51)

## 2016-03-30 MED ORDER — SUCRALFATE 1 G PO TABS: 1.0000 g | ORAL_TABLET | Freq: Three times a day (TID) | ORAL | Status: DC

## 2016-03-30 MED ORDER — LACTULOSE 20 G PO PACK: 20.0000 g | PACK | Freq: Two times a day (BID) | ORAL | Status: DC

## 2016-03-30 MED ORDER — GLYCERIN (LAXATIVE) 2 G RE SUPP: 1.0000 | Freq: Once | RECTAL | Status: DC

## 2016-03-30 MED ORDER — SODIUM CHLORIDE 0.9 % IV BOLUS (SEPSIS): 1000.0000 mL | Freq: Once | INTRAVENOUS | Status: DC

## 2016-03-30 MED ORDER — DOCUSATE SODIUM 100 MG PO CAPS: 100.0000 mg | ORAL_CAPSULE | Freq: Two times a day (BID) | ORAL | Status: DC

## 2016-03-30 NOTE — ED Notes (Signed)
Pt sts constipation with abd pain; pt sts some bleeding when she wipes and straining to have bowel movement; pt sts hx of "burning tongue"

## 2016-03-30 NOTE — ED Provider Notes (Signed)
CSN: [id number]     Arrival date & time 03/30/16  1128 History   First MD Initiated Contact with Patient 03/30/16 1328     Chief Complaint  Patient presents with  . Constipation     (Consider location/radiation/quality/duration/timing/severity/associated sxs/prior Treatment) HPI Patient presents to the emergency department with constipation over the last few days.  The patient states she recently started iron supplementation and they found that her iron levels were low.  The patient states that she has not taken any medications for the constipation.  She states she did have a bowel movement this morning and then one right associated to the emergency department.  She states that there was blood noted in the the blood was bright red.  She states that is why she came to the hospital due to the concerns for constipation and the bleeding.  The patient states that she has been having ongoing issues with her abdomen and tongue over the last year and a half.  Patient states that she has had a burning tongue. The patient denies chest pain, shortness of breath, headache,blurred vision, neck pain, fever, cough, weakness, numbness, dizziness, anorexia, edema, abdominal pain, nausea, vomiting, diarrhea, rash, back pain, dysuria, hematemesis, bloody stool, near syncope, or syncope.The patient has been straining a lot with bowel movements and noticed some blood in her stool that was bright red today History reviewed. No pertinent past medical history. Past Surgical History  Procedure Laterality Date  . Appendectomy    . Hernia repair     Family History  Problem Relation Age of Onset  . Diabetes Mother    Social History  Substance Use Topics  . Smoking status: Never Smoker   . Smokeless tobacco: Never Used  . Alcohol Use: Yes     Comment: rarely   OB History    Gravida Para Term Preterm AB TAB SAB Ectopic Multiple Living   0 0 0 0 0 0 0 0 0 0      Review of Systems   All other systems negative  except as documented in the HPI. All pertinent positives and negatives as reviewed in the HPI. Allergies  Fluoxetine and Doxepin  Home Medications   Prior to Admission medications   Medication Sig Start Date End Date Taking? Authorizing Provider  calcium carbonate (TUMS - DOSED IN MG ELEMENTAL CALCIUM) 500 MG chewable tablet Chew 1 tablet by mouth daily.   Yes Historical Provider, MD  esomeprazole (NEXIUM) 20 MG capsule Take 20 mg by mouth daily at 12 noon.   Yes Historical Provider, MD  ferrous sulfate 325 (65 FE) MG EC tablet Take 325 mg by mouth daily with breakfast.   Yes Historical Provider, MD  magnesium hydroxide (MILK OF MAGNESIA) 400 MG/5ML suspension Take 5 mLs by mouth daily as needed for mild constipation.   Yes Historical Provider, MD  nortriptyline (PAMELOR) 10 MG capsule Take 10 mg by mouth at bedtime. Reported on 03/30/2016   Yes Historical Provider, MD  promethazine (PHENERGAN) 6.25 MG/5ML syrup Take 6.25 mg by mouth every 6 (six) hours as needed for nausea or vomiting.   Yes Historical Provider, MD  vitamin B-12 (CYANOCOBALAMIN) 1000 MCG tablet Take 1,000 mcg by mouth daily.   Yes Historical Provider, MD   BP 117/73 mmHg  Pulse 110  Temp(Src) 98.6 F (37 C) (Oral)  Resp 16  SpO2 100%  LMP 02/15/2016 (Exact Date) Physical Exam  Constitutional: She is oriented to person, place, and time. She appears well-developed and well-nourished. No distress.  HENT:  Head: Normocephalic and atraumatic.  Mouth/Throat: Oropharynx is clear and moist.  Eyes: Pupils are equal, round, and reactive to light.  Neck: Normal range of motion. Neck supple.  Cardiovascular: Normal rate, regular rhythm and normal heart sounds.  Exam reveals no gallop and no friction rub.   No murmur heard. Pulmonary/Chest: Effort normal and breath sounds normal. No respiratory distress. She has no wheezes.  Abdominal: Soft. Bowel sounds are normal. She exhibits no distension. There is no tenderness.   Neurological: She is alert and oriented to person, place, and time. She exhibits normal muscle tone. Coordination normal.  Skin: Skin is warm and dry. No rash noted. No erythema.  Psychiatric: She has a normal mood and affect. Her behavior is normal.  Nursing note and vitals reviewed.   ED Course  Procedures (including critical care time) Labs Review Labs Reviewed  COMPREHENSIVE METABOLIC PANEL  CBC WITH DIFFERENTIAL/PLATELET  LIPASE, BLOOD  URINALYSIS, ROUTINE W REFLEX MICROSCOPIC (NOT AT [facility])  PREGNANCY, URINE    Imaging Review No results found. I have personally reviewed and evaluated these images and lab results as part of my medical decision-making.    [facility].  Patient is advised that we will ensure that there is no emergency today, but that she would need strong follow-up for her multiple symptoms  Charlestine Night, PA-C 03/30/16 [address] PA-C 03/30/16 0981  Benjiman Core, MD 04/01/16 1900

## 2016-03-30 NOTE — ED Notes (Signed)
Patient transported to X-ray 

## 2016-03-30 NOTE — ED Notes (Signed)
Pt is in stable condition upon d/c and ambulates from ED. 

## 2016-03-30 NOTE — ED Provider Notes (Signed)
Ct scan no stone.  Discharge instructions per Jordan Hawkshris Lawyer  Rolan Wrightsman K Courtenay Hirth, PA-C 03/30/16 [address] [id number]  Arby BarretteMarcy Pfeiffer, MD 04/07/16 [phone]

## 2016-03-30 NOTE — ED Notes (Signed)
Pt states she had a BM this morning and had bright red blood when she wiped. Pt states she made herself vomit on Friday rt acid reflux and states she has a burning tongue sensation.

## 2016-03-30 NOTE — ED Notes (Signed)
Pt given large pitcher of water to drink.

## 2016-03-30 NOTE — Discharge Instructions (Signed)
Return here as needed.  Follow-up with your GI doctor °

## 2016-04-01 ENCOUNTER — Telehealth: Payer: Self-pay

## 2016-04-01 NOTE — Telephone Encounter (Addendum)
Pt called and asked if HPV is cause her rectal bleeding.  I informed that HPV is not related to her rectal bleeding and that she should contact her PCP for f/u.   Pt stated understanding with no further questions.

## 2016-04-18 ENCOUNTER — Encounter: Payer: Commercial Managed Care - PPO | Admitting: Obstetrics and Gynecology

## 2016-05-02 ENCOUNTER — Ambulatory Visit (INDEPENDENT_AMBULATORY_CARE_PROVIDER_SITE_OTHER): Payer: Commercial Managed Care - PPO | Admitting: Obstetrics and Gynecology

## 2016-05-02 ENCOUNTER — Other Ambulatory Visit (HOSPITAL_COMMUNITY)
Admission: RE | Admit: 2016-05-02 | Discharge: 2016-05-02 | Disposition: A | Discharge status: Home / Self Care | Payer: Commercial Managed Care - PPO | Source: Ambulatory Visit | Attending: Obstetrics and Gynecology | Admitting: Obstetrics and Gynecology

## 2016-05-02 ENCOUNTER — Encounter: Payer: Self-pay | Admitting: General Practice

## 2016-05-02 ENCOUNTER — Encounter: Payer: Self-pay | Admitting: Obstetrics and Gynecology

## 2016-05-02 VITALS — BP 123/90 | HR 112 | Ht 59.0 in | Wt 247.2 lb

## 2016-05-02 DIAGNOSIS — R896 Abnormal cytological findings in specimens from other organs, systems and tissues: Secondary | ICD-10-CM | POA: Diagnosis not present

## 2016-05-02 DIAGNOSIS — IMO0002 Reserved for concepts with insufficient information to code with codable children: Secondary | ICD-10-CM

## 2016-05-02 DIAGNOSIS — R87612 Low grade squamous intraepithelial lesion on cytologic smear of cervix (LGSIL): Secondary | ICD-10-CM

## 2016-05-02 LAB — POCT PREGNANCY, URINE: PREG TEST UR: NEGATIVE

## 2016-05-02 NOTE — Progress Notes (Signed)
GYNECOLOGY CLINIC COLPOSCOPY VISIT AND PROCEDURE NOTE  25 y.o. G0P0000 here for colposcopy for lsil pap smear on 01/2016. Prior cervical cytology and/or colposcopy findings: "abnormal" pap in high school with normal f/u. The patient reports the following prior treatments to the vulva/vagina/cervix: none. The patient is not a cigarette smoker. The patient is not immunosuppressed. The patient is not pregnant. The patient is not taking anticoagulants and denies allergy to iodine.  Patient given informed consent, signed copy in the chart, time out was performed. Urine pregnancy test performed and confirmed to be negative.  Placed in lithotomy position.   Gross findings:   Vagina: normal  Vulva: small non-tender cystic structure right labia minora  Cervix: normal  Visualization after:  Acetic acid: sharp acetowhite borders and mosaicism most prominent at 7 o'clock  Green or blue filter: abnormal vessels 7 o'clocl  Upper one-third of vagina examined, revealing: normal mucosa  Biopsies obtained:  7 o'clock  ECC specimen obtained: yes  Colposcopy adequate? Yes  All specimens were labelled and sent to pathology.  Patient was given post procedure instructions.  Will follow up pathology and manage accordingly.      Shonna ChockNoah Justyna Timoney, MD OB/GYN Fellow

## 2016-05-03 ENCOUNTER — Encounter: Payer: Self-pay | Admitting: Obstetrics and Gynecology

## 2016-05-03 DIAGNOSIS — R87612 Low grade squamous intraepithelial lesion on cytologic smear of cervix (LGSIL): Secondary | ICD-10-CM | POA: Insufficient documentation

## 2016-05-04 ENCOUNTER — Telehealth: Payer: Self-pay | Admitting: *Deleted

## 2016-05-04 NOTE — Telephone Encounter (Signed)
Called patient and informed her of colpo result, need to f/u with PAP and hpv cotesting in 1 year. Understanding voiced.

## 2016-05-04 NOTE — Telephone Encounter (Signed)
-----   [facility] Wouk, MD sent at 05/03/2016  8:29 PM EDT ----- colpo normal. pls call patient to inform. Needs pap with hpv co-test in 12 months. Thanks, Anette RiedelNoah  ----- Message -----    From: [facility] [place]    Sent: 05/02/2016   1:01 PM      To: Kathrynn RunningNoah Bedford Wouk, MD

## 2016-05-18 ENCOUNTER — Encounter: Payer: Self-pay | Admitting: Obstetrics and Gynecology

## 2016-05-18 ENCOUNTER — Ambulatory Visit (INDEPENDENT_AMBULATORY_CARE_PROVIDER_SITE_OTHER): Payer: Commercial Managed Care - PPO | Admitting: Obstetrics and Gynecology

## 2016-05-18 VITALS — BP 110/76 | HR 88 | Resp 25 | Ht 59.0 in | Wt 252.8 lb

## 2016-05-18 DIAGNOSIS — N907 Vulvar cyst: Secondary | ICD-10-CM | POA: Diagnosis not present

## 2016-05-18 DIAGNOSIS — R87612 Low grade squamous intraepithelial lesion on cytologic smear of cervix (LGSIL): Secondary | ICD-10-CM

## 2016-05-18 NOTE — Patient Instructions (Signed)
We will call you about approval from your insurance company regarding the removal of the vulvar cyst.

## 2016-05-18 NOTE — Progress Notes (Signed)
Patient ID: Mercedes Watson, female   DOB: 11-23-1991, 25 y.o.   MRN: [id number] GYNECOLOGY  VISIT   HPI: 25 y.o.   Single  African American  female   [id number] with Patient's last menstrual period was 04/18/2016 (exact date).   here for evaluation of right vulvar cyst per patient.  Patient states had abnormal pap and colposcopy at [facility].  Colposcopy in June 2017 was adequate.  Final ECC and cervical biopsy were benign.  Due to have cotesting in one year.   Having burning sensation of her tongue.  Had her tonsils removed and she still had them removed.  Still has this sensation.  Seeing a specialist at [facility] for the burning tongue sensation.   Has vulvar cyst for __couple of years. Notes the cyst more when she voids.  States it droops and hangs when she does this. No drainage.  No change in size. She does not like the appearance.  On her parent's insurance and feeling pressured to complete all medical care prior to getting off this policy.  GYNECOLOGIC HISTORY: Patient's last menstrual period was 04/18/2016 (exact date). Contraception:  Abstinence Menopausal hormone therapy:  n/a Last mammogram:  n/a Last pap smear:   02-05-16 LGSIL;colposcopy 05-02-16 neg.cervical bx and neg ECC at [facility].        OB History    Gravida Para Term Preterm AB TAB SAB Ectopic Multiple Living           Patient Active Problem List   Diagnosis Date Noted  . Low grade squamous intraepith lesion on cytologic smear cervix (lgsil) 05/03/2016    Past Medical History  Diagnosis Date  . Anxiety   . STD (sexually transmitted disease) 2011    Treated for Chlamydia    Past Surgical History  Procedure Laterality Date  . Appendectomy    . Hernia repair      Current Outpatient Prescriptions  Medication Sig Dispense Refill  . gabapentin (NEURONTIN) 100 MG capsule Take 1 tablet by mouth 2 (two) times daily.    . prochlorperazine  (COMPAZINE) 10 MG tablet Take 1 tablet by mouth at bedtime.  1   No current facility-administered medications for this visit.     ALLERGIES: Fluoxetine and Doxepin  Family History  Problem Relation Age of Onset  . Diabetes Mother   . Hyperlipidemia Father   . Diabetes Paternal Grandfather     Social History   Social History  . Marital Status: Single    Spouse Name: N/A  . Number of Children: N/A  . Years of Education: N/A   Occupational History  . Not on file.   Social History Main Topics  . Smoking status: Never Smoker   . Smokeless tobacco: Never Used  . Alcohol Use: 1.2 oz/week    2 Standard drinks or equivalent per week     Comment: rarely  . Drug Use: No  . Sexual Activity:    Partners: Male    Pharmacist, hospital Protection: None, Abstinence   Other Topics Concern  . Not on file   Social History Narrative    ROS:  Pertinent items are noted in HPI.  PHYSICAL EXAMINATION:    BP 110/76 mmHg  Pulse 88  Resp 25  Ht  (1.499 m)  Wt 252 lb 12.8 oz (114.669 kg)  BMI 51.03 kg/m2  LMP 04/18/2016 (Exact Date)    General appearance: alert, cooperative and appears  stated age Head: Normocephalic, without obvious abnormality, atraumatic Neck: no adenopathy, supple, symmetrical, trachea midline and thyroid normal to inspection and palpation Lungs: clear to auscultation bilaterally  Heart: regular rate and rhythm Abdomen: incision(s):No., ____________, obese, soft, non-tender, no masses,  no organomegaly Extremities: extremities normal, atraumatic, no cyanosis or edema Skin: Skin color, texture, turgor normal. No rashes or lesions Lymph nodes: Cervical, supraclavicular, and axillary nodes normal. No abnormal inguinal nodes palpated Neurologic: Grossly normal  Pelvic: External genitalia:  Right labia minora with 0.75 cm soft compressible cyst, nontender.              Urethra:  normal appearing urethra with no masses, tenderness or lesions               Bartholins and Skenes: normal                 Vagina: normal appearing vagina with normal color and discharge, no lesions              Cervix: no lesions       Bimanual Exam:  Uterus:  normal size, contour, position, consistency, mobility, non-tender.  [facility].              Adnexa: normal adnexa and no mass, fullness, tenderness         Chaperone was present for exam.  ASSESSMENT   Vulvar cyst.   Recent abnormal pap and negative colposcopy evaluation.  Tingling tongue.   PLAN  Discussion of vulvar cysts.  Patient would like to pursue removal.  We will precert this.  I have explained the procedure.  I have also told her that she may observe this area.  I do not believe this is a precancerous or cancerous lesion, and it has been stable over a long time. Discussion of abnormal paps, HPV, and colposcopy.  [facility].  She knows she is due in one year. We will not be following her for this abnormal pap. [facility]  Follow up here prn.    An After Visit Summary was printed and given to the patient.  ____30__ minutes face to face time of which over 50% was spent in counseling.

## 2016-05-26 ENCOUNTER — Telehealth: Payer: Self-pay | Admitting: Obstetrics and Gynecology

## 2016-05-26 NOTE — Telephone Encounter (Signed)
Ok to cancel order and close encounter.

## 2016-05-26 NOTE — Telephone Encounter (Signed)
Spoke with patient regarding benefits for recommended procedure. Patient understood, but has declined to schedule at this time.   Routing to Dr Edward JollySilva for review

## 2017-03-04 IMAGING — CT CT RENAL STONE PROTOCOL
2 of 4 series · 11 of 46 positions shown, 12 images · non-contrast
Comparison: 08/31/2010

CLINICAL DATA: Lower abdominal cramping, bright red rectal bleeding

EXAM:
CT ABDOMEN AND PELVIS WITHOUT CONTRAST
TECHNIQUE: Multidetector CT imaging of the abdomen and pelvis was performed
following the standard protocol without IV contrast.

[Series 201: stone study, idose (2) · axial · 0.68mm/px · z∈[-502,-107]mm · 8 of 95 slices shown, 9 images]
[im 8/95  soft-tissue]
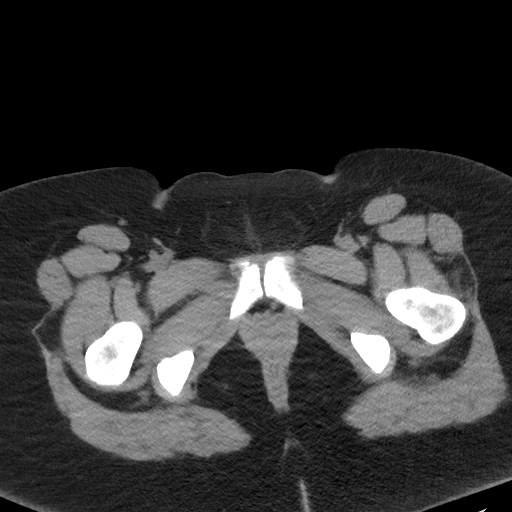
[im 8/95  bone]
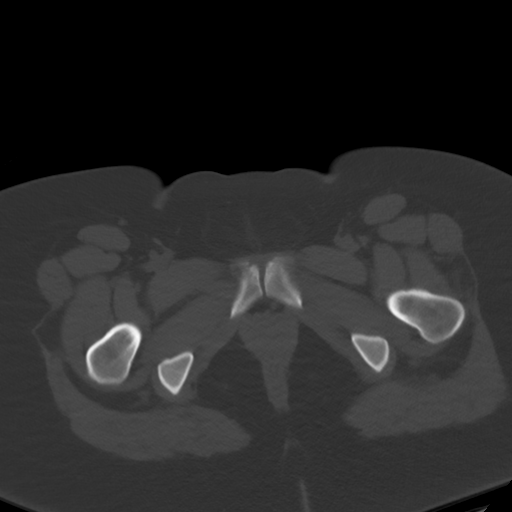
[im 19/95  soft-tissue]
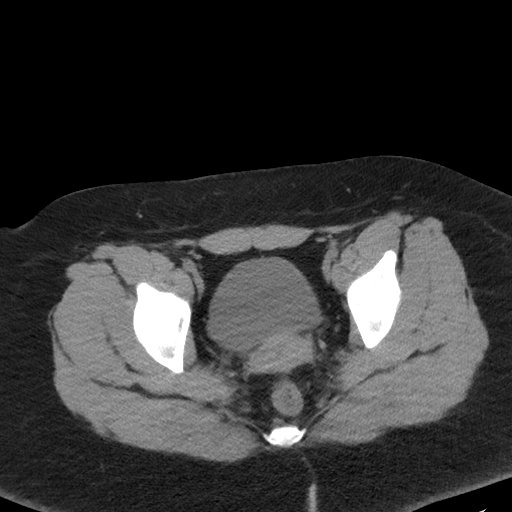
[im 31/95  soft-tissue]
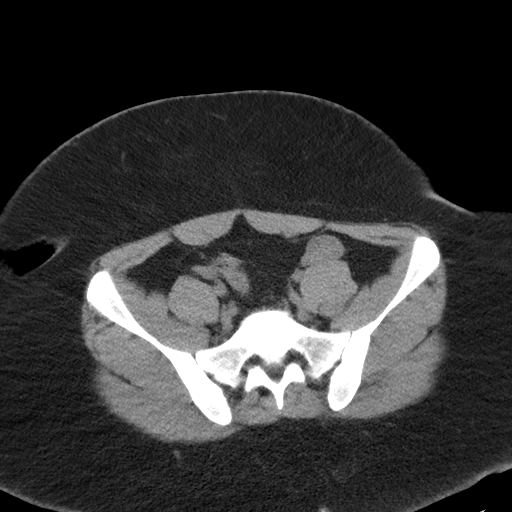
[im 42/95  soft-tissue]
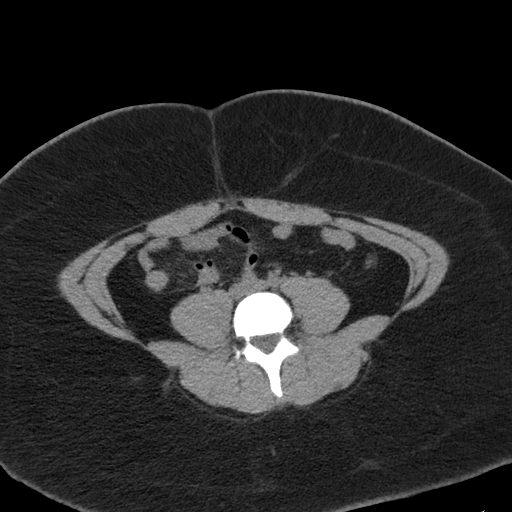
[im 53/95  soft-tissue]
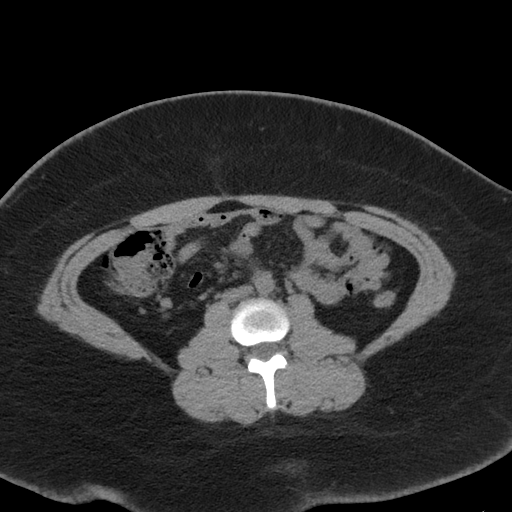
[im 64/95  soft-tissue]
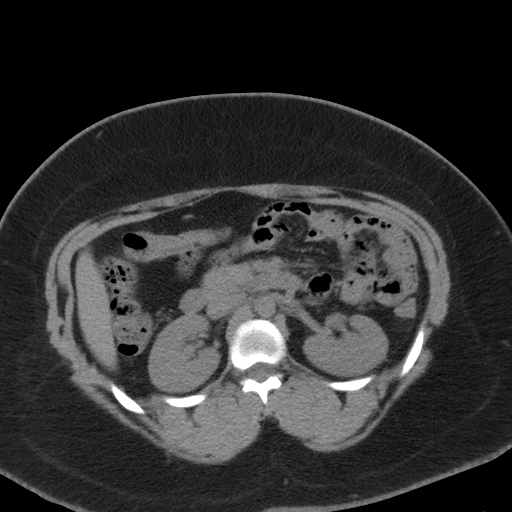
[im 76/95  soft-tissue]
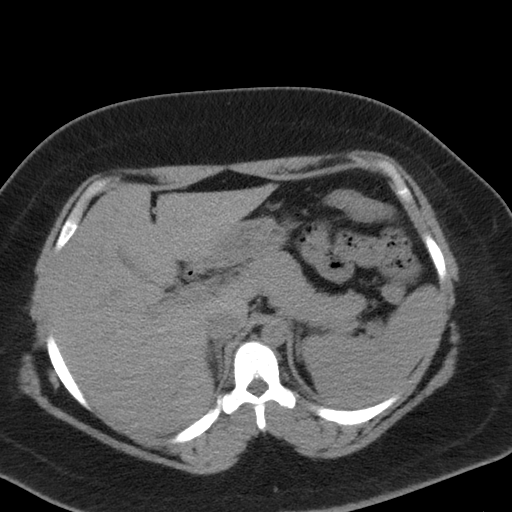
[im 87/95  soft-tissue]
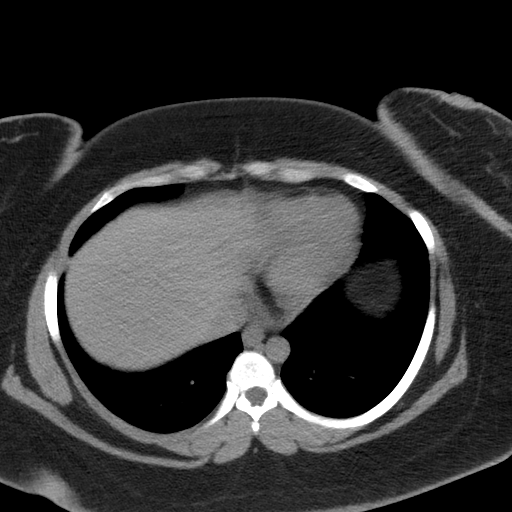

[Series 203: coronals, idose (2) · coronal · 0.45mm/px · 3 of 99 slices shown]
[im 33/99  soft-tissue]
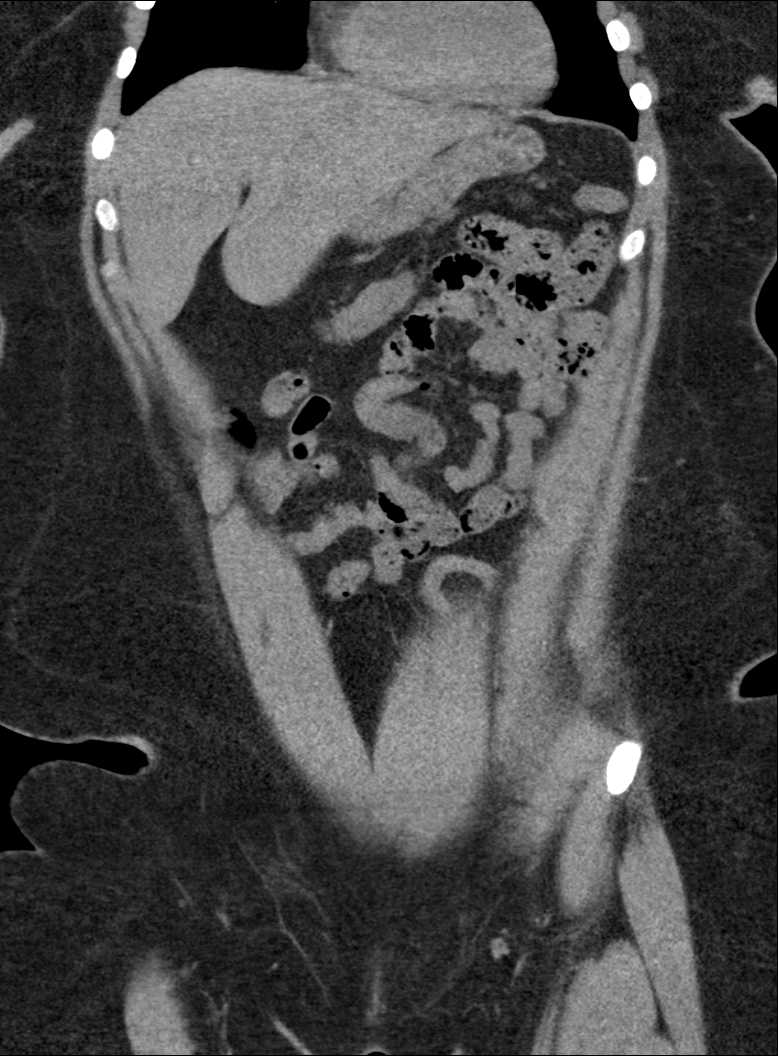
[im 44/99  soft-tissue]
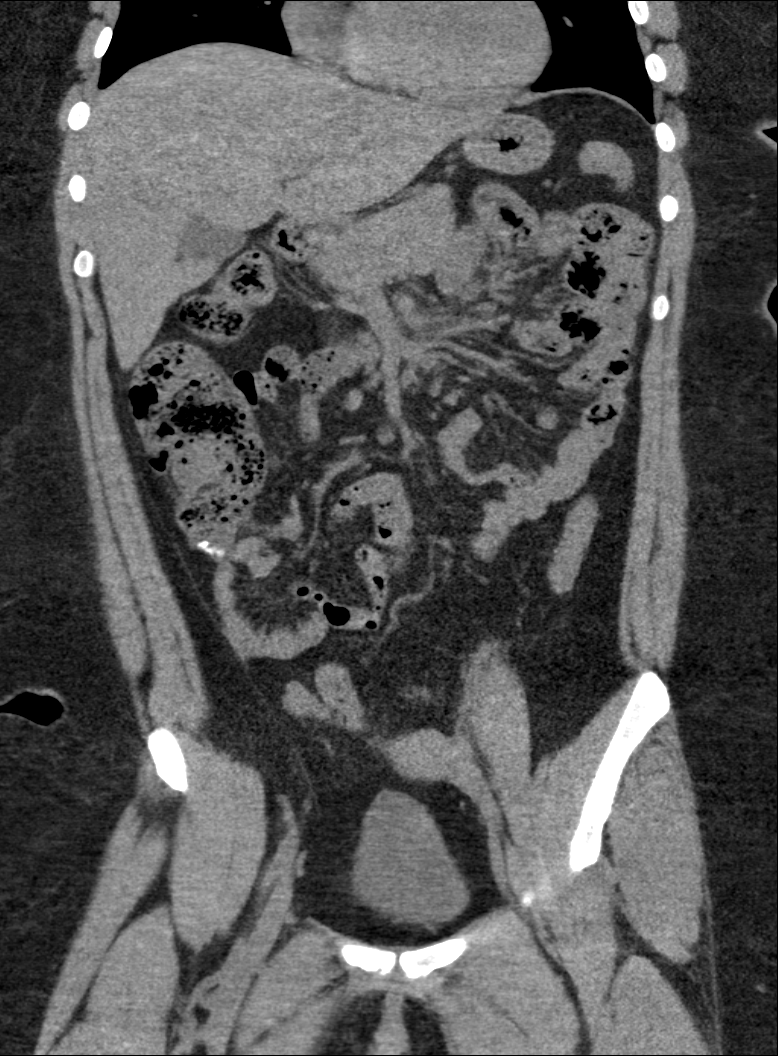
[im 55/99  soft-tissue]
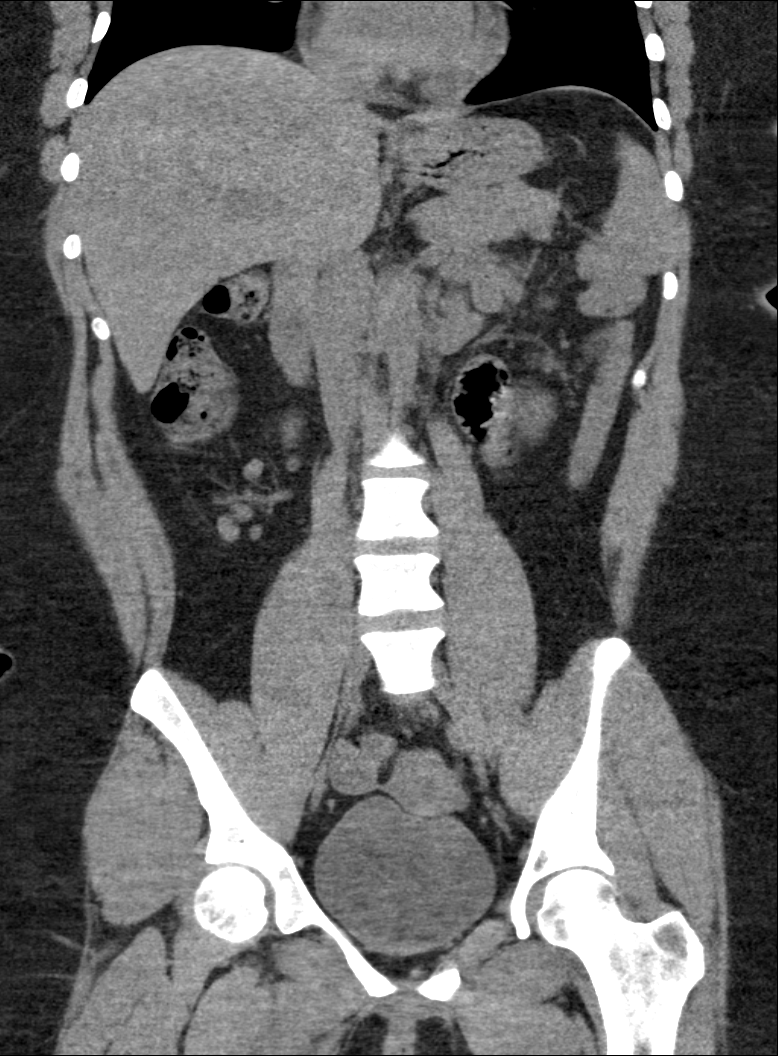

[11 of 46 positions shown; findings below may reference images not displayed]

FINDINGS: Lower chest:  Lung bases are clear.

Hepatobiliary: Unenhanced liver is unremarkable.

Gallbladder unremarkable. No intrahepatic or extrahepatic ductal
dilatation.

Pancreas: Within normal limits.

Spleen: Within normal limits.

Adrenals/Urinary Tract: Adrenal glands are within normal limits.

Kidneys are within normal limits.

No renal, ureteral, or bladder calculi.  No hydronephrosis.

Bladder is within normal limits.

Stomach/Bowel: Stomach is notable for a tiny hiatal hernia.

No evidence of bowel obstruction.

Status post appendectomy.

No colonic wall thickening or inflammatory changes.

Vascular/Lymphatic: No evidence of abdominal aortic aneurysm.

No suspicious abdominopelvic lymphadenopathy.

Small right lower quadrant/ ileocolic lymph nodes measuring up to 7
mm short axis (series 201/ image 48), likely reactive.

Reproductive: Uterus is within normal limits.

Bilateral ovaries are within normal limits.

Other: No abdominopelvic ascites.

Musculoskeletal: Visualized osseous structures are within normal
limits.
IMPRESSION: No evidence of bowel obstruction.  Prior appendectomy.

No colonic wall thickening or inflammatory changes.

No renal, ureteral, or bladder calculi.  No hydronephrosis.

No CT findings to account for the patient's lower abdominal pain.

## 2017-03-04 IMAGING — CR DG ABDOMEN ACUTE W/ 1V CHEST
4 series · 4 of 4 positions shown · non-contrast
Comparison: CT abdomen and pelvis August 31, 2010

CLINICAL DATA: Abdominal pain and constipation

EXAM:
DG ABDOMEN ACUTE W/ 1V CHEST

[chest pa]
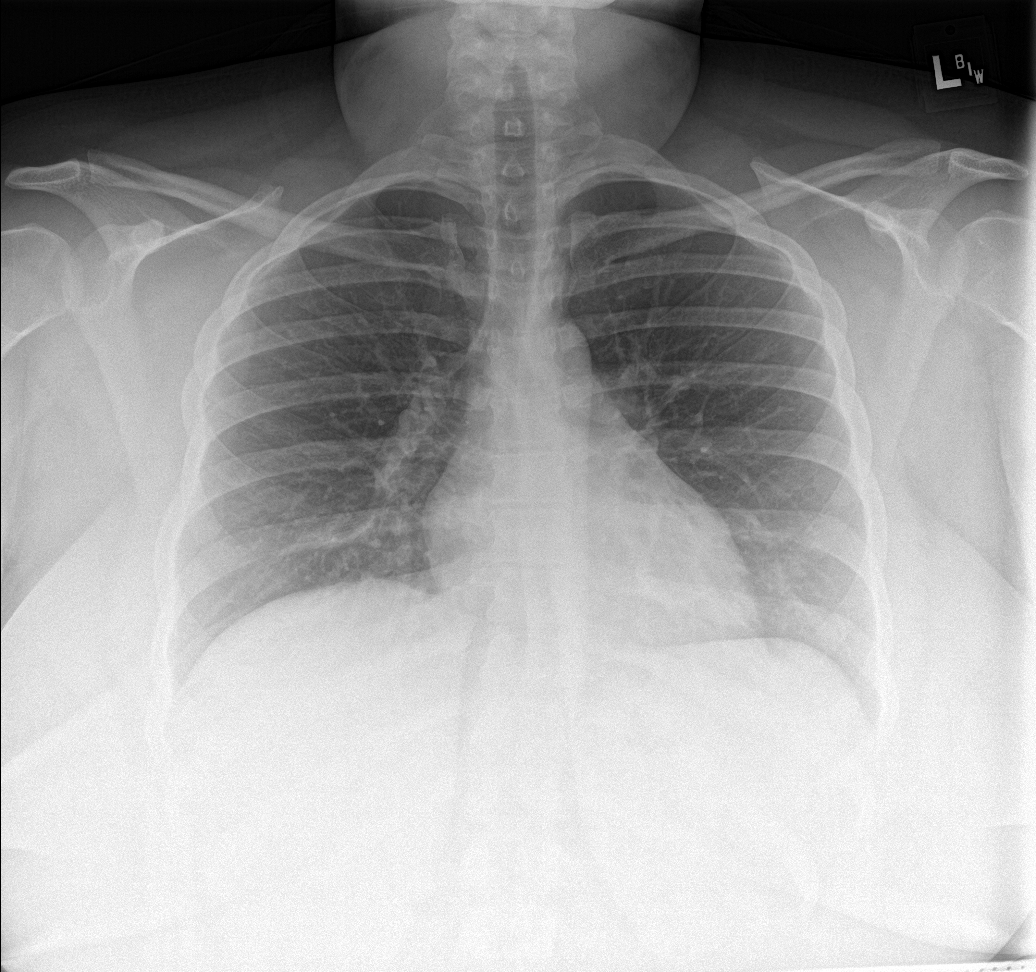

[abdomen erect]
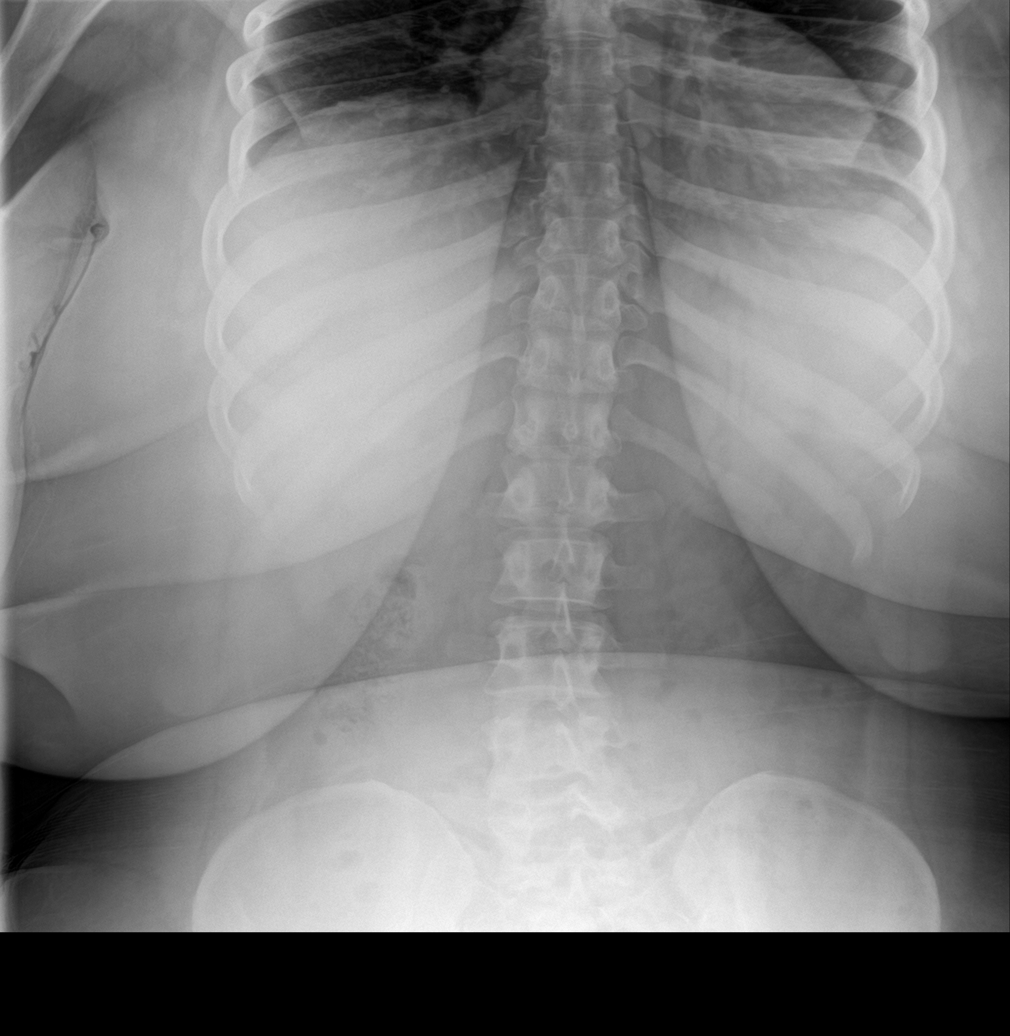

[abdomen supine (1 of 2)]
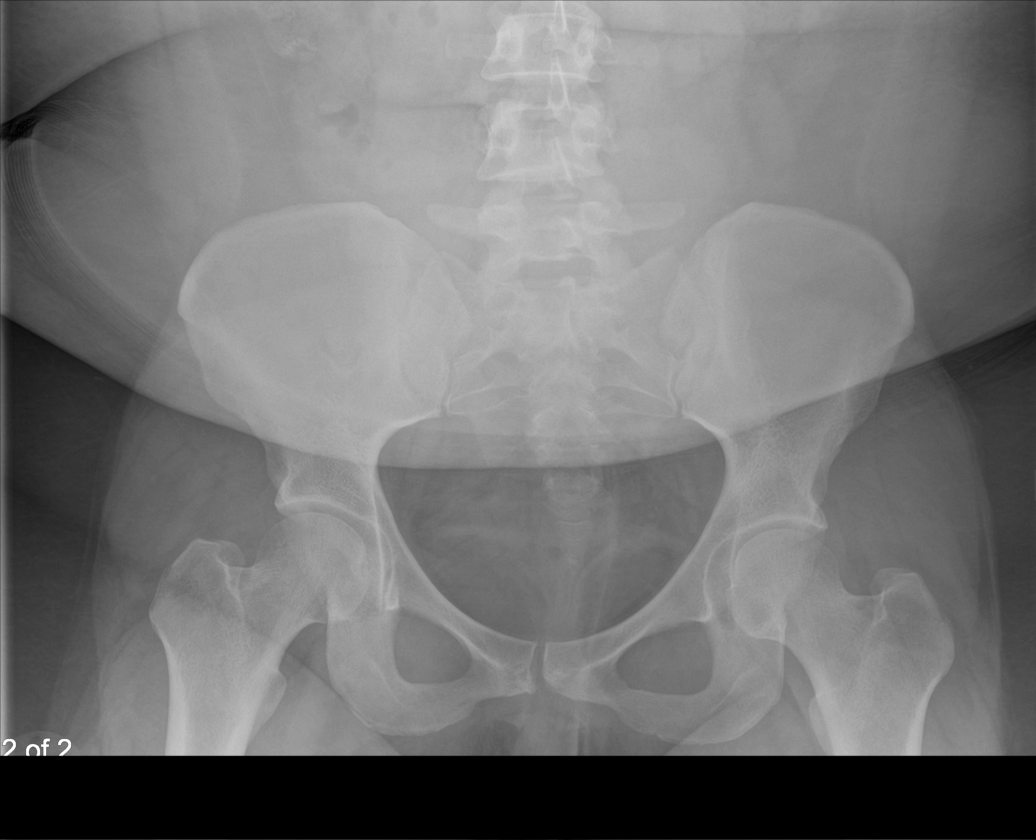

[abdomen supine (2 of 2)]
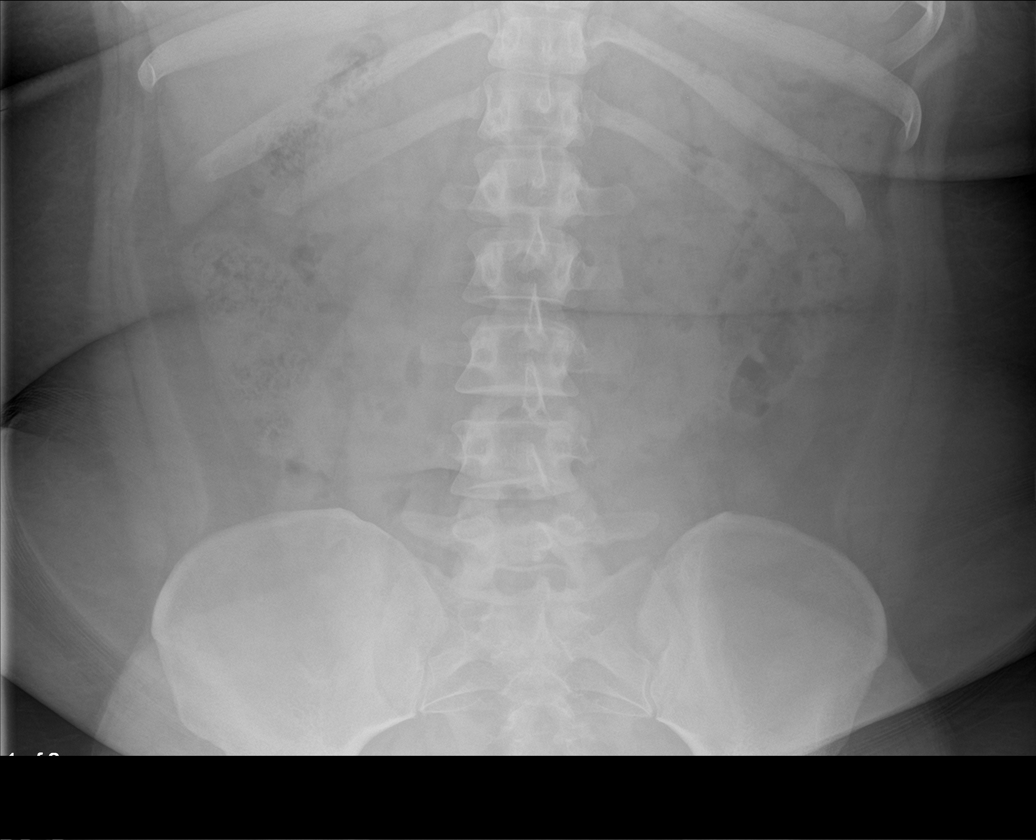

[4 of 4 positions shown; findings below may reference images not displayed]

FINDINGS: PA chest: Lungs are clear. Heart size and pulmonary vascularity are
normal. No adenopathy.

Supine and upright abdomen: There is moderate stool throughout the
colon. There is a generalized paucity of gas. There is no bowel
dilatation or air-fluid level to suggest obstruction. No free air.
No abnormal calcifications are identified.
IMPRESSION: Generalized paucity of gas. This finding may be seen normally.
However, it also may be indicative of a degree of enteritis or early
ileus. Obstruction is not felt to be likely. No free air. Lungs
clear.

## 2020-03-12 DIAGNOSIS — F411 Generalized anxiety disorder: Secondary | ICD-10-CM | POA: Diagnosis not present

## 2020-03-12 DIAGNOSIS — Z131 Encounter for screening for diabetes mellitus: Secondary | ICD-10-CM | POA: Diagnosis not present

## 2020-03-12 DIAGNOSIS — E7800 Pure hypercholesterolemia, unspecified: Secondary | ICD-10-CM | POA: Diagnosis not present

## 2020-03-12 DIAGNOSIS — Z6841 Body Mass Index (BMI) 40.0 and over, adult: Secondary | ICD-10-CM | POA: Diagnosis not present

## 2020-03-12 DIAGNOSIS — K146 Glossodynia: Secondary | ICD-10-CM | POA: Diagnosis not present

## 2020-03-12 DIAGNOSIS — Z0000 Encounter for general adult medical examination without abnormal findings: Secondary | ICD-10-CM | POA: Diagnosis not present

## 2020-03-12 DIAGNOSIS — F329 Major depressive disorder, single episode, unspecified: Secondary | ICD-10-CM | POA: Diagnosis not present

## 2020-03-12 DIAGNOSIS — L280 Lichen simplex chronicus: Secondary | ICD-10-CM | POA: Diagnosis not present

## 2020-04-24 DIAGNOSIS — Z23 Encounter for immunization: Secondary | ICD-10-CM | POA: Diagnosis not present

## 2020-04-24 DIAGNOSIS — Z01419 Encounter for gynecological examination (general) (routine) without abnormal findings: Secondary | ICD-10-CM | POA: Diagnosis not present

## 2020-04-24 DIAGNOSIS — N644 Mastodynia: Secondary | ICD-10-CM | POA: Diagnosis not present

## 2020-04-24 DIAGNOSIS — K219 Gastro-esophageal reflux disease without esophagitis: Secondary | ICD-10-CM | POA: Diagnosis not present

## 2020-04-24 DIAGNOSIS — E7800 Pure hypercholesterolemia, unspecified: Secondary | ICD-10-CM | POA: Diagnosis not present

## 2020-04-24 DIAGNOSIS — Z6841 Body Mass Index (BMI) 40.0 and over, adult: Secondary | ICD-10-CM | POA: Diagnosis not present

## 2020-04-24 DIAGNOSIS — Z0000 Encounter for general adult medical examination without abnormal findings: Secondary | ICD-10-CM | POA: Diagnosis not present

## 2020-04-24 DIAGNOSIS — I82890 Acute embolism and thrombosis of other specified veins: Secondary | ICD-10-CM | POA: Diagnosis not present

## 2020-04-24 DIAGNOSIS — N611 Abscess of the breast and nipple: Secondary | ICD-10-CM | POA: Diagnosis not present

## 2020-04-24 DIAGNOSIS — F411 Generalized anxiety disorder: Secondary | ICD-10-CM | POA: Diagnosis not present

## 2020-05-07 DIAGNOSIS — N644 Mastodynia: Secondary | ICD-10-CM | POA: Diagnosis not present

## 2020-05-29 DIAGNOSIS — R87618 Other abnormal cytological findings on specimens from cervix uteri: Secondary | ICD-10-CM | POA: Diagnosis not present

## 2020-05-29 DIAGNOSIS — N888 Other specified noninflammatory disorders of cervix uteri: Secondary | ICD-10-CM | POA: Diagnosis not present

## 2020-05-29 DIAGNOSIS — R87810 Cervical high risk human papillomavirus (HPV) DNA test positive: Secondary | ICD-10-CM | POA: Diagnosis not present

## 2020-05-29 DIAGNOSIS — N711 Chronic inflammatory disease of uterus: Secondary | ICD-10-CM | POA: Diagnosis not present

## 2020-09-15 DIAGNOSIS — L304 Erythema intertrigo: Secondary | ICD-10-CM | POA: Diagnosis not present

## 2020-09-15 DIAGNOSIS — T148XXA Other injury of unspecified body region, initial encounter: Secondary | ICD-10-CM | POA: Diagnosis not present

## 2020-09-15 DIAGNOSIS — R61 Generalized hyperhidrosis: Secondary | ICD-10-CM | POA: Diagnosis not present

## 2020-09-15 DIAGNOSIS — Z6841 Body Mass Index (BMI) 40.0 and over, adult: Secondary | ICD-10-CM | POA: Diagnosis not present

## 2020-09-15 DIAGNOSIS — L810 Postinflammatory hyperpigmentation: Secondary | ICD-10-CM | POA: Diagnosis not present

## 2020-09-15 DIAGNOSIS — L700 Acne vulgaris: Secondary | ICD-10-CM | POA: Diagnosis not present

## 2021-05-06 DIAGNOSIS — F411 Generalized anxiety disorder: Secondary | ICD-10-CM | POA: Diagnosis not present

## 2021-05-06 DIAGNOSIS — Z01419 Encounter for gynecological examination (general) (routine) without abnormal findings: Secondary | ICD-10-CM | POA: Diagnosis not present

## 2021-05-06 DIAGNOSIS — R87612 Low grade squamous intraepithelial lesion on cytologic smear of cervix (LGSIL): Secondary | ICD-10-CM | POA: Diagnosis not present

## 2021-05-06 DIAGNOSIS — Z6841 Body Mass Index (BMI) 40.0 and over, adult: Secondary | ICD-10-CM | POA: Diagnosis not present

## 2021-05-06 DIAGNOSIS — Z0000 Encounter for general adult medical examination without abnormal findings: Secondary | ICD-10-CM | POA: Diagnosis not present

## 2021-05-06 DIAGNOSIS — Z8619 Personal history of other infectious and parasitic diseases: Secondary | ICD-10-CM | POA: Diagnosis not present

## 2021-05-06 DIAGNOSIS — F329 Major depressive disorder, single episode, unspecified: Secondary | ICD-10-CM | POA: Diagnosis not present

## 2021-07-07 DIAGNOSIS — Z131 Encounter for screening for diabetes mellitus: Secondary | ICD-10-CM | POA: Diagnosis not present

## 2021-07-07 DIAGNOSIS — G43011 Migraine without aura, intractable, with status migrainosus: Secondary | ICD-10-CM | POA: Diagnosis not present

## 2021-08-06 DIAGNOSIS — R519 Headache, unspecified: Secondary | ICD-10-CM | POA: Diagnosis not present

## 2021-08-06 DIAGNOSIS — R0683 Snoring: Secondary | ICD-10-CM | POA: Diagnosis not present

## 2021-08-06 DIAGNOSIS — R7303 Prediabetes: Secondary | ICD-10-CM | POA: Diagnosis not present

## 2021-09-17 ENCOUNTER — Other Ambulatory Visit (HOSPITAL_COMMUNITY): Payer: Self-pay

## 2021-09-17 MED ORDER — SERTRALINE HCL 50 MG PO TABS
150.0000 mg | ORAL_TABLET | Freq: Every day | ORAL | 1 refills | Status: DC
  Filled 2021-09-17: qty 270, 90d supply, fill #0

## 2021-12-05 DIAGNOSIS — Z20822 Contact with and (suspected) exposure to covid-19: Secondary | ICD-10-CM | POA: Diagnosis not present

## 2021-12-09 DIAGNOSIS — R7303 Prediabetes: Secondary | ICD-10-CM | POA: Diagnosis not present

## 2021-12-09 DIAGNOSIS — Z6841 Body Mass Index (BMI) 40.0 and over, adult: Secondary | ICD-10-CM | POA: Diagnosis not present

## 2021-12-09 DIAGNOSIS — R1013 Epigastric pain: Secondary | ICD-10-CM | POA: Diagnosis not present

## 2021-12-13 ENCOUNTER — Other Ambulatory Visit (HOSPITAL_COMMUNITY): Payer: Self-pay

## 2021-12-14 ENCOUNTER — Other Ambulatory Visit (HOSPITAL_COMMUNITY): Payer: Self-pay

## 2021-12-14 MED ORDER — SERTRALINE HCL 50 MG PO TABS
ORAL_TABLET | ORAL | 1 refills | Status: AC
  Filled 2021-12-14: qty 270, 90d supply, fill #0
  Filled 2022-08-03: qty 270, 90d supply, fill #1

## 2021-12-23 ENCOUNTER — Other Ambulatory Visit (HOSPITAL_COMMUNITY): Payer: Self-pay

## 2021-12-23 DIAGNOSIS — F411 Generalized anxiety disorder: Secondary | ICD-10-CM | POA: Diagnosis not present

## 2021-12-23 DIAGNOSIS — K769 Liver disease, unspecified: Secondary | ICD-10-CM | POA: Diagnosis not present

## 2021-12-23 DIAGNOSIS — R110 Nausea: Secondary | ICD-10-CM | POA: Diagnosis not present

## 2021-12-23 DIAGNOSIS — R1013 Epigastric pain: Secondary | ICD-10-CM | POA: Diagnosis not present

## 2021-12-23 MED ORDER — PANTOPRAZOLE SODIUM 40 MG PO TBEC
DELAYED_RELEASE_TABLET | ORAL | 2 refills | Status: AC
  Filled 2021-12-23: qty 30, 30d supply, fill #0
  Filled 2022-02-01: qty 30, 30d supply, fill #1
  Filled 2022-08-03: qty 30, 30d supply, fill #2

## 2021-12-23 MED ORDER — SERTRALINE HCL 50 MG PO TABS
ORAL_TABLET | ORAL | 3 refills | Status: AC
  Filled 2021-12-23: qty 270, 90d supply, fill #0

## 2022-02-01 ENCOUNTER — Other Ambulatory Visit (HOSPITAL_COMMUNITY): Payer: Self-pay

## 2022-08-03 ENCOUNTER — Other Ambulatory Visit (HOSPITAL_COMMUNITY): Payer: Self-pay

## 2022-08-04 ENCOUNTER — Other Ambulatory Visit (HOSPITAL_COMMUNITY): Payer: Self-pay

## 2023-02-08 DIAGNOSIS — R030 Elevated blood-pressure reading, without diagnosis of hypertension: Secondary | ICD-10-CM | POA: Diagnosis not present

## 2023-02-08 DIAGNOSIS — Z0000 Encounter for general adult medical examination without abnormal findings: Secondary | ICD-10-CM | POA: Diagnosis not present

## 2023-02-08 DIAGNOSIS — N393 Stress incontinence (female) (male): Secondary | ICD-10-CM | POA: Diagnosis not present

## 2023-02-08 DIAGNOSIS — G44209 Tension-type headache, unspecified, not intractable: Secondary | ICD-10-CM | POA: Diagnosis not present

## 2023-02-08 DIAGNOSIS — N946 Dysmenorrhea, unspecified: Secondary | ICD-10-CM | POA: Diagnosis not present

## 2023-02-08 DIAGNOSIS — R7303 Prediabetes: Secondary | ICD-10-CM | POA: Diagnosis not present

## 2023-02-15 DIAGNOSIS — N946 Dysmenorrhea, unspecified: Secondary | ICD-10-CM | POA: Diagnosis not present

## 2023-02-27 DIAGNOSIS — E559 Vitamin D deficiency, unspecified: Secondary | ICD-10-CM | POA: Diagnosis not present

## 2023-02-27 DIAGNOSIS — D649 Anemia, unspecified: Secondary | ICD-10-CM | POA: Diagnosis not present

## 2023-02-27 DIAGNOSIS — R5383 Other fatigue: Secondary | ICD-10-CM | POA: Diagnosis not present

## 2023-02-27 DIAGNOSIS — Z113 Encounter for screening for infections with a predominantly sexual mode of transmission: Secondary | ICD-10-CM | POA: Diagnosis not present

## 2023-03-15 DIAGNOSIS — S61212A Laceration without foreign body of right middle finger without damage to nail, initial encounter: Secondary | ICD-10-CM | POA: Diagnosis not present

## 2023-03-15 DIAGNOSIS — S6991XA Unspecified injury of right wrist, hand and finger(s), initial encounter: Secondary | ICD-10-CM | POA: Diagnosis not present

## 2023-03-30 DIAGNOSIS — D500 Iron deficiency anemia secondary to blood loss (chronic): Secondary | ICD-10-CM | POA: Diagnosis not present

## 2023-03-30 DIAGNOSIS — N946 Dysmenorrhea, unspecified: Secondary | ICD-10-CM | POA: Diagnosis not present

## 2023-03-30 DIAGNOSIS — Z01812 Encounter for preprocedural laboratory examination: Secondary | ICD-10-CM | POA: Diagnosis not present

## 2023-03-30 DIAGNOSIS — N858 Other specified noninflammatory disorders of uterus: Secondary | ICD-10-CM | POA: Diagnosis not present

## 2023-03-30 DIAGNOSIS — N920 Excessive and frequent menstruation with regular cycle: Secondary | ICD-10-CM | POA: Diagnosis not present

## 2023-03-30 DIAGNOSIS — B977 Papillomavirus as the cause of diseases classified elsewhere: Secondary | ICD-10-CM | POA: Diagnosis not present

## 2023-03-30 DIAGNOSIS — R9389 Abnormal findings on diagnostic imaging of other specified body structures: Secondary | ICD-10-CM | POA: Diagnosis not present

## 2023-04-03 DIAGNOSIS — F4321 Adjustment disorder with depressed mood: Secondary | ICD-10-CM | POA: Diagnosis not present

## 2023-04-03 DIAGNOSIS — Z0289 Encounter for other administrative examinations: Secondary | ICD-10-CM | POA: Diagnosis not present

## 2023-04-03 DIAGNOSIS — R5383 Other fatigue: Secondary | ICD-10-CM | POA: Diagnosis not present

## 2023-05-16 DIAGNOSIS — E611 Iron deficiency: Secondary | ICD-10-CM | POA: Diagnosis not present

## 2023-05-16 DIAGNOSIS — R42 Dizziness and giddiness: Secondary | ICD-10-CM | POA: Diagnosis not present

## 2023-05-16 DIAGNOSIS — R5383 Other fatigue: Secondary | ICD-10-CM | POA: Diagnosis not present

## 2023-05-16 DIAGNOSIS — E538 Deficiency of other specified B group vitamins: Secondary | ICD-10-CM | POA: Diagnosis not present

## 2023-12-20 DIAGNOSIS — Z6841 Body Mass Index (BMI) 40.0 and over, adult: Secondary | ICD-10-CM | POA: Diagnosis not present

## 2023-12-20 DIAGNOSIS — R0683 Snoring: Secondary | ICD-10-CM | POA: Diagnosis not present

## 2023-12-20 DIAGNOSIS — D509 Iron deficiency anemia, unspecified: Secondary | ICD-10-CM | POA: Diagnosis not present

## 2023-12-20 DIAGNOSIS — E559 Vitamin D deficiency, unspecified: Secondary | ICD-10-CM | POA: Diagnosis not present

## 2023-12-20 DIAGNOSIS — R519 Headache, unspecified: Secondary | ICD-10-CM | POA: Diagnosis not present

## 2023-12-20 DIAGNOSIS — Z131 Encounter for screening for diabetes mellitus: Secondary | ICD-10-CM | POA: Diagnosis not present

## 2024-01-16 DIAGNOSIS — R0683 Snoring: Secondary | ICD-10-CM | POA: Diagnosis not present

## 2024-01-16 DIAGNOSIS — Z6841 Body Mass Index (BMI) 40.0 and over, adult: Secondary | ICD-10-CM | POA: Diagnosis not present

## 2024-01-19 DIAGNOSIS — R0683 Snoring: Secondary | ICD-10-CM | POA: Diagnosis not present

## 2024-01-24 DIAGNOSIS — H52203 Unspecified astigmatism, bilateral: Secondary | ICD-10-CM | POA: Diagnosis not present

## 2024-02-14 DIAGNOSIS — F331 Major depressive disorder, recurrent, moderate: Secondary | ICD-10-CM | POA: Diagnosis not present

## 2024-02-14 DIAGNOSIS — Z7689 Persons encountering health services in other specified circumstances: Secondary | ICD-10-CM | POA: Diagnosis not present

## 2024-02-14 DIAGNOSIS — R7303 Prediabetes: Secondary | ICD-10-CM | POA: Diagnosis not present

## 2024-02-14 DIAGNOSIS — Z6841 Body Mass Index (BMI) 40.0 and over, adult: Secondary | ICD-10-CM | POA: Diagnosis not present

## 2024-02-14 DIAGNOSIS — R5383 Other fatigue: Secondary | ICD-10-CM | POA: Diagnosis not present

## 2024-02-14 DIAGNOSIS — Z0000 Encounter for general adult medical examination without abnormal findings: Secondary | ICD-10-CM | POA: Diagnosis not present

## 2024-02-14 DIAGNOSIS — Z79899 Other long term (current) drug therapy: Secondary | ICD-10-CM | POA: Diagnosis not present

## 2024-02-14 DIAGNOSIS — F411 Generalized anxiety disorder: Secondary | ICD-10-CM | POA: Diagnosis not present

## 2024-03-31 DIAGNOSIS — R519 Headache, unspecified: Secondary | ICD-10-CM | POA: Diagnosis not present

## 2024-03-31 DIAGNOSIS — R42 Dizziness and giddiness: Secondary | ICD-10-CM | POA: Diagnosis not present

## 2024-03-31 DIAGNOSIS — R110 Nausea: Secondary | ICD-10-CM | POA: Diagnosis not present

## 2024-04-01 DIAGNOSIS — E611 Iron deficiency: Secondary | ICD-10-CM | POA: Diagnosis not present

## 2024-04-02 DIAGNOSIS — I498 Other specified cardiac arrhythmias: Secondary | ICD-10-CM | POA: Diagnosis not present

## 2024-04-03 DIAGNOSIS — E611 Iron deficiency: Secondary | ICD-10-CM | POA: Diagnosis not present

## 2024-04-10 DIAGNOSIS — E611 Iron deficiency: Secondary | ICD-10-CM | POA: Diagnosis not present

## 2024-04-12 DIAGNOSIS — E611 Iron deficiency: Secondary | ICD-10-CM | POA: Diagnosis not present

## 2024-05-06 DIAGNOSIS — N946 Dysmenorrhea, unspecified: Secondary | ICD-10-CM | POA: Diagnosis not present

## 2024-05-06 DIAGNOSIS — R9389 Abnormal findings on diagnostic imaging of other specified body structures: Secondary | ICD-10-CM | POA: Diagnosis not present

## 2024-05-06 DIAGNOSIS — R87618 Other abnormal cytological findings on specimens from cervix uteri: Secondary | ICD-10-CM | POA: Diagnosis not present

## 2024-05-06 DIAGNOSIS — N393 Stress incontinence (female) (male): Secondary | ICD-10-CM | POA: Diagnosis not present

## 2024-05-06 DIAGNOSIS — E669 Obesity, unspecified: Secondary | ICD-10-CM | POA: Diagnosis not present

## 2024-05-06 DIAGNOSIS — D500 Iron deficiency anemia secondary to blood loss (chronic): Secondary | ICD-10-CM | POA: Diagnosis not present

## 2024-05-06 DIAGNOSIS — N920 Excessive and frequent menstruation with regular cycle: Secondary | ICD-10-CM | POA: Diagnosis not present

## 2024-05-22 DIAGNOSIS — Z6841 Body Mass Index (BMI) 40.0 and over, adult: Secondary | ICD-10-CM | POA: Diagnosis not present

## 2024-05-22 DIAGNOSIS — R8290 Unspecified abnormal findings in urine: Secondary | ICD-10-CM | POA: Diagnosis not present

## 2024-05-22 DIAGNOSIS — E611 Iron deficiency: Secondary | ICD-10-CM | POA: Diagnosis not present

## 2024-05-22 DIAGNOSIS — K219 Gastro-esophageal reflux disease without esophagitis: Secondary | ICD-10-CM | POA: Diagnosis not present

## 2024-06-19 DIAGNOSIS — N920 Excessive and frequent menstruation with regular cycle: Secondary | ICD-10-CM | POA: Diagnosis not present

## 2024-06-19 DIAGNOSIS — N840 Polyp of corpus uteri: Secondary | ICD-10-CM | POA: Diagnosis not present

## 2024-06-19 DIAGNOSIS — Z888 Allergy status to other drugs, medicaments and biological substances status: Secondary | ICD-10-CM | POA: Diagnosis not present

## 2024-06-19 DIAGNOSIS — Z30430 Encounter for insertion of intrauterine contraceptive device: Secondary | ICD-10-CM | POA: Diagnosis not present

## 2024-06-19 DIAGNOSIS — Z79899 Other long term (current) drug therapy: Secondary | ICD-10-CM | POA: Diagnosis not present

## 2024-06-19 DIAGNOSIS — R9389 Abnormal findings on diagnostic imaging of other specified body structures: Secondary | ICD-10-CM | POA: Diagnosis not present

## 2024-06-19 DIAGNOSIS — N946 Dysmenorrhea, unspecified: Secondary | ICD-10-CM | POA: Diagnosis not present

## 2024-06-19 DIAGNOSIS — R87810 Cervical high risk human papillomavirus (HPV) DNA test positive: Secondary | ICD-10-CM | POA: Diagnosis not present

## 2024-07-09 DIAGNOSIS — R350 Frequency of micturition: Secondary | ICD-10-CM | POA: Diagnosis not present

## 2024-07-22 DIAGNOSIS — E611 Iron deficiency: Secondary | ICD-10-CM | POA: Diagnosis not present

## 2024-07-22 DIAGNOSIS — Z6841 Body Mass Index (BMI) 40.0 and over, adult: Secondary | ICD-10-CM | POA: Diagnosis not present

## 2024-07-22 DIAGNOSIS — E538 Deficiency of other specified B group vitamins: Secondary | ICD-10-CM | POA: Diagnosis not present

## 2024-07-22 DIAGNOSIS — N8500 Endometrial hyperplasia, unspecified: Secondary | ICD-10-CM | POA: Diagnosis not present

## 2024-07-22 DIAGNOSIS — K2100 Gastro-esophageal reflux disease with esophagitis, without bleeding: Secondary | ICD-10-CM | POA: Diagnosis not present

## 2024-07-22 DIAGNOSIS — F330 Major depressive disorder, recurrent, mild: Secondary | ICD-10-CM | POA: Diagnosis not present

## 2024-07-22 DIAGNOSIS — E66813 Obesity, class 3: Secondary | ICD-10-CM | POA: Diagnosis not present

## 2024-07-22 DIAGNOSIS — Z7689 Persons encountering health services in other specified circumstances: Secondary | ICD-10-CM | POA: Diagnosis not present

## 2024-07-22 DIAGNOSIS — N920 Excessive and frequent menstruation with regular cycle: Secondary | ICD-10-CM | POA: Diagnosis not present

## 2024-07-22 DIAGNOSIS — E559 Vitamin D deficiency, unspecified: Secondary | ICD-10-CM | POA: Diagnosis not present

## 2024-07-22 DIAGNOSIS — R87810 Cervical high risk human papillomavirus (HPV) DNA test positive: Secondary | ICD-10-CM | POA: Diagnosis not present

## 2024-08-07 DIAGNOSIS — E538 Deficiency of other specified B group vitamins: Secondary | ICD-10-CM | POA: Diagnosis not present

## 2024-08-14 DIAGNOSIS — R1084 Generalized abdominal pain: Secondary | ICD-10-CM | POA: Diagnosis not present

## 2024-08-14 DIAGNOSIS — Z975 Presence of (intrauterine) contraceptive device: Secondary | ICD-10-CM | POA: Diagnosis not present

## 2024-08-14 DIAGNOSIS — E538 Deficiency of other specified B group vitamins: Secondary | ICD-10-CM | POA: Diagnosis not present

## 2024-08-21 DIAGNOSIS — E538 Deficiency of other specified B group vitamins: Secondary | ICD-10-CM | POA: Diagnosis not present

## 2024-09-20 DIAGNOSIS — E538 Deficiency of other specified B group vitamins: Secondary | ICD-10-CM | POA: Diagnosis not present

## 2024-09-23 ENCOUNTER — Other Ambulatory Visit (HOSPITAL_COMMUNITY): Payer: Self-pay

## 2024-09-24 ENCOUNTER — Other Ambulatory Visit (HOSPITAL_COMMUNITY): Payer: Self-pay

## 2024-09-24 ENCOUNTER — Other Ambulatory Visit: Payer: Self-pay

## 2024-09-24 MED ORDER — FAMOTIDINE 20 MG PO TABS
20.0000 mg | ORAL_TABLET | Freq: Two times a day (BID) | ORAL | 3 refills | Status: DC | PRN
  Filled 2024-09-24: qty 180, 90d supply, fill #0

## 2024-09-24 MED ORDER — VITAMIN D-3 125 MCG (5000 UT) PO TABS
5000.0000 [IU] | ORAL_TABLET | Freq: Every day | ORAL | 3 refills | Status: AC
  Filled 2024-09-24: qty 100, 100d supply, fill #0

## 2024-09-25 ENCOUNTER — Other Ambulatory Visit (HOSPITAL_COMMUNITY): Payer: Self-pay

## 2024-09-25 ENCOUNTER — Other Ambulatory Visit: Payer: Self-pay

## 2024-10-21 DIAGNOSIS — E538 Deficiency of other specified B group vitamins: Secondary | ICD-10-CM | POA: Diagnosis not present

## 2024-10-23 DIAGNOSIS — K449 Diaphragmatic hernia without obstruction or gangrene: Secondary | ICD-10-CM | POA: Diagnosis not present

## 2024-10-23 DIAGNOSIS — K2100 Gastro-esophageal reflux disease with esophagitis, without bleeding: Secondary | ICD-10-CM | POA: Diagnosis not present

## 2024-10-28 ENCOUNTER — Other Ambulatory Visit (HOSPITAL_COMMUNITY): Payer: Self-pay

## 2024-10-29 ENCOUNTER — Other Ambulatory Visit (HOSPITAL_COMMUNITY): Payer: Self-pay

## 2024-10-29 ENCOUNTER — Other Ambulatory Visit: Payer: Self-pay

## 2024-10-29 MED ORDER — ESOMEPRAZOLE MAGNESIUM 40 MG PO CPDR
40.0000 mg | DELAYED_RELEASE_CAPSULE | Freq: Two times a day (BID) | ORAL | 3 refills | Status: AC
  Filled 2024-10-29: qty 180, 90d supply, fill #0

## 2024-10-30 ENCOUNTER — Other Ambulatory Visit (HOSPITAL_COMMUNITY): Payer: Self-pay

## 2024-12-03 ENCOUNTER — Other Ambulatory Visit (HOSPITAL_COMMUNITY): Payer: Self-pay

## 2024-12-03 MED ORDER — FAMOTIDINE 40 MG PO TABS
40.0000 mg | ORAL_TABLET | Freq: Every evening | ORAL | 3 refills | Status: AC | PRN
  Filled 2024-12-03: qty 90, 90d supply, fill #0

## 2024-12-18 ENCOUNTER — Other Ambulatory Visit (HOSPITAL_COMMUNITY): Payer: Self-pay
